# Patient Record
Sex: Male | Born: 1995 | Race: White | Hispanic: No | Marital: Single | Smoking: Never smoker
Health system: Southern US, Community
[De-identification: ages and names within clinical notes are randomized; demographics above are authoritative.]

## PROBLEM LIST (undated history)

## (undated) DIAGNOSIS — I780 Hereditary hemorrhagic telangiectasia: Secondary | ICD-10-CM

## (undated) DIAGNOSIS — D699 Hemorrhagic condition, unspecified: Secondary | ICD-10-CM

## (undated) HISTORY — DX: Hereditary hemorrhagic telangiectasia: I78.0

## (undated) HISTORY — PX: TONSILLECTOMY: SUR1361

## (undated) HISTORY — DX: Hemorrhagic condition, unspecified: D69.9

---
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
## 2017-11-24 ENCOUNTER — Encounter: Payer: Self-pay | Admitting: Emergency Medicine

## 2017-11-24 ENCOUNTER — Other Ambulatory Visit: Payer: Self-pay

## 2017-11-24 DIAGNOSIS — A419 Sepsis, unspecified organism: Principal | ICD-10-CM | POA: Diagnosis present

## 2017-11-24 DIAGNOSIS — K5792 Diverticulitis of intestine, part unspecified, without perforation or abscess without bleeding: Secondary | ICD-10-CM | POA: Diagnosis present

## 2017-11-24 DIAGNOSIS — Z720 Tobacco use: Secondary | ICD-10-CM

## 2017-11-24 DIAGNOSIS — R946 Abnormal results of thyroid function studies: Secondary | ICD-10-CM | POA: Diagnosis present

## 2017-11-24 DIAGNOSIS — K5090 Crohn's disease, unspecified, without complications: Secondary | ICD-10-CM | POA: Diagnosis present

## 2017-11-24 DIAGNOSIS — R1084 Generalized abdominal pain: Secondary | ICD-10-CM | POA: Diagnosis not present

## 2017-11-24 DIAGNOSIS — E860 Dehydration: Secondary | ICD-10-CM | POA: Diagnosis present

## 2017-11-24 DIAGNOSIS — Z880 Allergy status to penicillin: Secondary | ICD-10-CM

## 2017-11-24 LAB — CBC
HEMATOCRIT: 44.1 % (ref 40.0–52.0)
HEMOGLOBIN: 15 g/dL (ref 13.0–18.0)
MCH: 28.8 pg (ref 26.0–34.0)
MCHC: 34 g/dL (ref 32.0–36.0)
MCV: 84.8 fL (ref 80.0–100.0)
Platelets: 258 10*3/uL (ref 150–440)
RBC: 5.2 MIL/uL (ref 4.40–5.90)
RDW: 13.7 % (ref 11.5–14.5)
WBC: 20.3 10*3/uL — AB (ref 3.8–10.6)

## 2017-11-24 NOTE — ED Triage Notes (Signed)
Patient ambulatory to triage with steady gait, without difficulty or distress noted; pt reports since yesterday having generalized abd pain with no accomp symptoms; denies hx of same

## 2017-11-25 ENCOUNTER — Emergency Department: Payer: BLUE CROSS/BLUE SHIELD

## 2017-11-25 ENCOUNTER — Other Ambulatory Visit: Payer: Self-pay

## 2017-11-25 ENCOUNTER — Inpatient Hospital Stay
Admission: EM | Admit: 2017-11-25 | Discharge: 2017-11-26 | DRG: 872 | Disposition: A | Discharge status: Home / Self Care | Payer: BLUE CROSS/BLUE SHIELD | Attending: Internal Medicine | Admitting: Internal Medicine

## 2017-11-25 DIAGNOSIS — K5090 Crohn's disease, unspecified, without complications: Secondary | ICD-10-CM | POA: Diagnosis present

## 2017-11-25 DIAGNOSIS — K5792 Diverticulitis of intestine, part unspecified, without perforation or abscess without bleeding: Secondary | ICD-10-CM | POA: Diagnosis present

## 2017-11-25 DIAGNOSIS — R1084 Generalized abdominal pain: Secondary | ICD-10-CM | POA: Diagnosis present

## 2017-11-25 DIAGNOSIS — Z880 Allergy status to penicillin: Secondary | ICD-10-CM | POA: Diagnosis not present

## 2017-11-25 DIAGNOSIS — R946 Abnormal results of thyroid function studies: Secondary | ICD-10-CM | POA: Diagnosis present

## 2017-11-25 DIAGNOSIS — K5732 Diverticulitis of large intestine without perforation or abscess without bleeding: Secondary | ICD-10-CM | POA: Diagnosis not present

## 2017-11-25 DIAGNOSIS — R9389 Abnormal findings on diagnostic imaging of other specified body structures: Secondary | ICD-10-CM | POA: Diagnosis not present

## 2017-11-25 DIAGNOSIS — E860 Dehydration: Secondary | ICD-10-CM | POA: Diagnosis present

## 2017-11-25 DIAGNOSIS — A419 Sepsis, unspecified organism: Secondary | ICD-10-CM

## 2017-11-25 DIAGNOSIS — Z720 Tobacco use: Secondary | ICD-10-CM | POA: Diagnosis not present

## 2017-11-25 DIAGNOSIS — K529 Noninfective gastroenteritis and colitis, unspecified: Secondary | ICD-10-CM | POA: Diagnosis present

## 2017-11-25 LAB — TYPE AND SCREEN
ABO/RH(D): O POS
Antibody Screen: NEGATIVE

## 2017-11-25 LAB — URINALYSIS, COMPLETE (UACMP) WITH MICROSCOPIC
BACTERIA UA: NONE SEEN
BILIRUBIN URINE: NEGATIVE
GLUCOSE, UA: NEGATIVE mg/dL
HGB URINE DIPSTICK: NEGATIVE
Ketones, ur: 80 mg/dL — AB
LEUKOCYTES UA: NEGATIVE
NITRITE: NEGATIVE
PROTEIN: 30 mg/dL — AB
Specific Gravity, Urine: 1.029 (ref 1.005–1.030)
Squamous Epithelial / LPF: NONE SEEN (ref 0–5)
pH: 5 (ref 5.0–8.0)

## 2017-11-25 LAB — COMPREHENSIVE METABOLIC PANEL
ALK PHOS: 60 U/L (ref 38–126)
ALT: 23 U/L (ref 17–63)
AST: 26 U/L (ref 15–41)
Albumin: 4.9 g/dL (ref 3.5–5.0)
Anion gap: 14 (ref 5–15)
BUN: 13 mg/dL (ref 6–20)
CALCIUM: 9.5 mg/dL (ref 8.9–10.3)
CO2: 24 mmol/L (ref 22–32)
CREATININE: 0.96 mg/dL (ref 0.61–1.24)
Chloride: 96 mmol/L — ABNORMAL LOW (ref 101–111)
GFR calc Af Amer: >60 mL/min (ref >60)
Glucose, Bld: 93 mg/dL (ref 65–99)
Potassium: 3.9 mmol/L (ref 3.5–5.1)
Sodium: 134 mmol/L — ABNORMAL LOW (ref 135–145)
TOTAL PROTEIN: 8.5 g/dL — AB (ref 6.5–8.1)
Total Bilirubin: 1.3 mg/dL — ABNORMAL HIGH (ref 0.3–1.2)

## 2017-11-25 LAB — TSH: TSH: 14.875 u[IU]/mL — ABNORMAL HIGH (ref 0.350–4.500)

## 2017-11-25 LAB — LIPASE, BLOOD: LIPASE: 21 U/L (ref 11–51)

## 2017-11-25 LAB — PROTIME-INR
INR: 1.24
PROTHROMBIN TIME: 15.5 s — AB (ref 11.4–15.2)

## 2017-11-25 LAB — T4, FREE: Free T4: 1.06 ng/dL (ref 0.82–1.77)

## 2017-11-25 LAB — APTT: aPTT: 36 seconds (ref 24–36)

## 2017-11-25 MED ORDER — IOPAMIDOL (ISOVUE-300) INJECTION 61%
30.0000 mL | Freq: Once | INTRAVENOUS | Status: AC | PRN
  Administered 2017-11-25: 30 mL via ORAL

## 2017-11-25 MED ORDER — SODIUM CHLORIDE 0.9 % IV BOLUS
1000.0000 mL | INTRAVENOUS | Status: AC
  Administered 2017-11-25: 1000 mL via INTRAVENOUS

## 2017-11-25 MED ORDER — ACETAMINOPHEN 650 MG RE SUPP: 650.0000 mg | Freq: Four times a day (QID) | RECTAL | Status: DC | PRN

## 2017-11-25 MED ORDER — ONDANSETRON HCL 4 MG/2ML IJ SOLN
4.0000 mg | INTRAMUSCULAR | Status: AC
  Administered 2017-11-25: 4 mg via INTRAVENOUS
  Filled 2017-11-25: qty 2

## 2017-11-25 MED ORDER — SODIUM CHLORIDE 0.9 % IV SOLN
2.0000 g | Freq: Once | INTRAVENOUS | Status: AC
  Administered 2017-11-25: 2 g via INTRAVENOUS
  Filled 2017-11-25: qty 20

## 2017-11-25 MED ORDER — MORPHINE SULFATE (PF) 4 MG/ML IV SOLN
4.0000 mg | Freq: Once | INTRAVENOUS | Status: AC
  Administered 2017-11-25: 4 mg via INTRAVENOUS
  Filled 2017-11-25: qty 1

## 2017-11-25 MED ORDER — METRONIDAZOLE 500 MG PO TABS
500.0000 mg | ORAL_TABLET | Freq: Three times a day (TID) | ORAL | Status: DC
  Administered 2017-11-25 – 2017-11-26 (×5): 500 mg via ORAL
  Filled 2017-11-25 (×6): qty 1

## 2017-11-25 MED ORDER — SODIUM CHLORIDE 0.9 % IV SOLN: Freq: Once | INTRAVENOUS | Status: DC

## 2017-11-25 MED ORDER — IOPAMIDOL (ISOVUE-300) INJECTION 61%
100.0000 mL | Freq: Once | INTRAVENOUS | Status: AC | PRN
  Administered 2017-11-25: 100 mL via INTRAVENOUS

## 2017-11-25 MED ORDER — DOCUSATE SODIUM 100 MG PO CAPS
100.0000 mg | ORAL_CAPSULE | Freq: Two times a day (BID) | ORAL | Status: DC
  Administered 2017-11-25 (×2): 100 mg via ORAL
  Filled 2017-11-25 (×3): qty 1

## 2017-11-25 MED ORDER — METRONIDAZOLE IN NACL 5-0.79 MG/ML-% IV SOLN
500.0000 mg | Freq: Once | INTRAVENOUS | Status: AC
  Administered 2017-11-25: 500 mg via INTRAVENOUS
  Filled 2017-11-25: qty 100

## 2017-11-25 MED ORDER — ACETAMINOPHEN 325 MG PO TABS
650.0000 mg | ORAL_TABLET | Freq: Four times a day (QID) | ORAL | Status: DC | PRN
  Administered 2017-11-25 – 2017-11-26 (×3): 650 mg via ORAL
  Filled 2017-11-25 (×3): qty 2

## 2017-11-25 MED ORDER — DEXTROSE-NACL 5-0.45 % IV SOLN
INTRAVENOUS | Status: DC
  Administered 2017-11-25 – 2017-11-26 (×5): via INTRAVENOUS

## 2017-11-25 MED ORDER — CIPROFLOXACIN HCL 500 MG PO TABS
500.0000 mg | ORAL_TABLET | Freq: Two times a day (BID) | ORAL | Status: DC
  Administered 2017-11-25 – 2017-11-26 (×2): 500 mg via ORAL
  Filled 2017-11-25 (×2): qty 1

## 2017-11-25 MED ORDER — CIPROFLOXACIN HCL 500 MG PO TABS
500.0000 mg | ORAL_TABLET | Freq: Two times a day (BID) | ORAL | Status: DC
  Administered 2017-11-25: 500 mg via ORAL
  Filled 2017-11-25: qty 1

## 2017-11-25 MED ORDER — ONDANSETRON HCL 4 MG/2ML IJ SOLN: 4.0000 mg | Freq: Four times a day (QID) | INTRAMUSCULAR | Status: DC | PRN

## 2017-11-25 MED ORDER — ONDANSETRON HCL 4 MG PO TABS: 4.0000 mg | ORAL_TABLET | Freq: Four times a day (QID) | ORAL | Status: DC | PRN

## 2017-11-25 NOTE — H&P (Signed)
Bill [name] is an 22 y.o. male.   Chief Complaint: Abdominal pain HPI: The patient with no chronic medical problems presents to the emergency department complaining of abdominal pain x2 days.  The patient states the pain was worse yesterday and he had 3 episodes of nonbloody nonbilious emesis.  He has had some loose stools but really denies diarrhea.  Stools have been nonbloody as well.  CT of his abdomen in the emergency department showed inflammation in the ileocecal region which may represent underlying diverticulitis and/or Crohn's.  Staff initially thought that he may have peritonitis and he was given prednisone and Flagyl.  Once he was stabilized emergency department staff called the hospitalist service for admission.  History reviewed. No pertinent past medical history. None  Past Surgical History:  Procedure Laterality Date  . TONSILLECTOMY      No family history on file.  No chronic illnesses  Social History:  reports that he has never smoked. He uses smokeless tobacco. He reports that he does not drink alcohol. His drug history is not on file.  Allergies:  Allergies  Allergen Reactions  . Penicillins Hives    No medications prior to admission.    Results for orders placed or performed during the hospital encounter of 11/25/17 (from the past 48 hour(s))  Lipase, blood     Status: None   Collection Time: 11/24/17 10:41 PM  Result Value Ref Range   Lipase 21 11 - 51 U/L    Comment: Performed at [facility], [address] [place], [place] [address]  Comprehensive metabolic panel     Status: Abnormal   Collection Time: 11/24/17 10:41 PM  Result Value Ref Range   Sodium 134 (L) 135 - 145 mmol/L   Potassium 3.9 3.5 - 5.1 mmol/L   Chloride 96 (L) 101 - 111 mmol/L   CO2 24 22 - 32 mmol/L   Glucose, Bld 93 65 - 99 mg/dL   BUN 13 6 - 20 mg/dL   Creatinine, Ser 0.96 0.61 - 1.24 mg/dL   Calcium 9.5 8.9 - 10.3 mg/dL   Total Protein 8.5 (H) 6.5 - 8.1 g/dL   Albumin  4.9 3.5 - 5.0 g/dL   AST 26 15 - 41 U/L   ALT 23 17 - 63 U/L   Alkaline Phosphatase 60 38 - 126 U/L   Total Bilirubin 1.3 (H) 0.3 - 1.2 mg/dL   GFR calc non Af Amer >60 >60 mL/min   GFR calc Af Amer >60 >60 mL/min    Comment: (NOTE) The eGFR has been calculated using the CKD EPI equation. This calculation has not been validated in all clinical situations. eGFR's persistently <60 mL/min signify possible Chronic Kidney Disease.    Anion gap 14 5 - 15    Comment: Performed at [facility], [address] [place], [place] [address]  CBC     Status: Abnormal   Collection Time: 11/24/17 10:41 PM  Result Value Ref Range   WBC 20.3 (H) 3.8 - 10.6 K/uL   RBC 5.20 4.40 - 5.90 MIL/uL   Hemoglobin 15.0 13.0 - 18.0 g/dL   HCT 44.1 40.0 - 52.0 %   MCV 84.8 80.0 - 100.0 fL   MCH 28.8 26.0 - 34.0 pg   MCHC 34.0 32.0 - 36.0 g/dL   RDW 13.7 11.5 - 14.5 %   Platelets 258 150 - 440 K/uL    Comment: Performed at [facility], [address], [place], [place] [address]  Urinalysis,  Complete w Microscopic     Status: Abnormal   Collection Time: 11/24/17 10:41 PM  Result Value Ref Range   Color, Urine AMBER (A) YELLOW    Comment: BIOCHEMICALS MAY BE AFFECTED BY COLOR   APPearance CLEAR (A) CLEAR   Specific Gravity, Urine 1.029 1.005 - 1.030   pH 5.0 5.0 - 8.0   Glucose, UA NEGATIVE NEGATIVE mg/dL   Hgb urine dipstick NEGATIVE NEGATIVE   Bilirubin Urine NEGATIVE NEGATIVE   Ketones, ur 80 (A) NEGATIVE mg/dL   Protein, ur 30 (A) NEGATIVE mg/dL   Nitrite NEGATIVE NEGATIVE   Leukocytes, UA NEGATIVE NEGATIVE   RBC / HPF 0-5 0 - 5 RBC/hpf   WBC, UA 0-5 0 - 5 WBC/hpf   Bacteria, UA NONE SEEN NONE SEEN   Squamous Epithelial / LPF NONE SEEN 0 - 5   Mucus PRESENT     Comment: Performed at [facility], [address] [place], [place] [address]  TSH     Status: Abnormal   Collection Time: 11/24/17 10:41 PM  Result Value Ref Range   TSH 14.875 (H) 0.350 - 4.500 uIU/mL     Comment: Performed by a 3rd Generation assay with a functional sensitivity of <=0.01 uIU/mL. Performed at [facility], [address] [place], [place] [address]   Protime-INR     Status: Abnormal   Collection Time: 11/25/17  3:24 AM  Result Value Ref Range   Prothrombin Time 15.5 (H) 11.4 - 15.2 seconds   INR 1.24     Comment: Performed at [facility], [address] [place], [place] [address]  APTT     Status: None   Collection Time: 11/25/17  3:24 AM  Result Value Ref Range   aPTT 36 24 - 36 seconds    Comment: Performed at [facility], [address] [place], [place] [address]  Type and screen Ordered by PROVIDER DEFAULT     Status: None   Collection Time: 11/25/17  4:12 AM  Result Value Ref Range   ABO/RH(D) O POS    Antibody Screen NEG    Sample Expiration      11/28/2017 Performed at [facility], [address], [place], [place] [address]    Ct Abdomen Pelvis W Contrast  Result Date: 11/25/2017 CLINICAL DATA:  Acute onset of nausea and vomiting. Generalized abdominal pain. EXAM: CT ABDOMEN AND PELVIS WITH CONTRAST TECHNIQUE: Multidetector CT imaging of the abdomen and pelvis was performed using the standard protocol following bolus administration of intravenous contrast. CONTRAST:  170m ISOVUE-300 IOPAMIDOL (ISOVUE-300) INJECTION 61% COMPARISON:  CT of the abdomen and pelvis performed 06/05/2015 FINDINGS: Lower chest: The visualized lung bases are grossly clear. The visualized portions of the mediastinum are unremarkable. Hepatobiliary: The liver is unremarkable in appearance. The gallbladder is unremarkable in appearance. The common bile duct remains normal in caliber. Pancreas: The pancreas is within normal limits. Spleen: The spleen is enlarged, measuring 14.2 cm in length. Adrenals/Urinary Tract: The adrenal glands are unremarkable in appearance. The kidneys are within normal limits. There is no evidence of hydronephrosis. No  renal or ureteral stones are identified. No perinephric stranding is seen. Stomach/Bowel: There is diffuse soft tissue inflammation noted about the cecum and proximal ascending colon, and about the terminal ileum, with marked wall thickening and underlying pericecal lymphadenopathy. A 9 mm high-density focus adjacent to the cecum is nonspecific and could reside within an underlying inflamed diverticulum. This may reflect either significant diverticulitis or colitis. The  appendix is normal in caliber, without evidence of appendicitis. The remainder of the colon is unremarkable in appearance. The small bowel is grossly unremarkable. The stomach is largely filled with contrast and air, and is grossly unremarkable. Vascular/Lymphatic: The abdominal aorta is unremarkable in appearance. The inferior vena cava is grossly unremarkable. No retroperitoneal lymphadenopathy is seen. No pelvic sidewall lymphadenopathy is identified. Reproductive: The bladder is significantly distended and grossly unremarkable. The prostate remains normal in size. Other: A small amount of free fluid is noted within the pelvis. Musculoskeletal: No acute osseous abnormalities are identified. The visualized musculature is unremarkable in appearance. IMPRESSION: 1. Diffuse soft tissue inflammation about the cecum and proximal ascending colon, and about the terminal ileum, with marked wall thickening and underlying pericecal lymphadenopathy. 9 mm high-density focus adjacent to the cecum is nonspecific and could reside within an underlying inflamed diverticulum. This may reflect either significant diverticulitis or colitis. 2. Small amount of free fluid noted within the pelvis. 3. No evidence of appendicitis. 4. Splenomegaly. Electronically Signed   By: Garald Balding M.D.   On: 11/25/2017 04:02    ROS  Blood pressure 136/82, pulse 96, temperature 99.7 F (37.6 C), temperature source Oral, resp. rate (!) 0, height 5' 6" (1.676 m), weight 78 kg  (171 lb 15.3 oz), SpO2 100 %. Physical Exam   Assessment/Plan This is a 22 year old male admitted for colitis. 1.  Colitis: Differential diagnosis includes inflammatory bowel disease or diverticulitis.  Gastroenterology consulted. 2.  Sepsis: The patient meets criteria via fever and leukocytosis.  He is hemodynamically stable.  Follow blood cultures for growth and sensitivities. 3.  DVT prophylaxis: SCDs 4.  GI prophylaxis: None The patient is a full code.  Time spent on admission orders and patient care approximately 45 minutes  Harrie Foreman, MD 11/25/2017, 8:21 AM

## 2017-11-25 NOTE — Consult Note (Signed)
Wyline Mood , MD [address], [address], [place], [place], [address] [address], [place], [place], [address] Phone: [phone]  Fax: [phone]  Consultation  Referring Provider:  Dr Amado Coe Primary Care Physician:  Patient, No Pcp Per Primary Gastroenterologist:None     Reason for Consultation:  Colitis  Date of Admission:  11/25/2017 Date of Consultation:  11/25/2017         HPI:   Bill [name] is a 22 y.o. male presented to the emergency room on 11/24/2017 with abdominal pain over the last 2 days.  In the emergency room he was febrile.  He underwent a CT scan of the abdomen.  Demonstrated diffuse soft tissue inflammation around the cecum proximal ascending colon, terminal ileum with marked wall thickening and pericecal lymphadenopathy.  In addition there was a 9 mm high density focus adjacent to the cecum which was nonspecific and could have represented significant diverticulitis or colitis.  He has had a CT scan of the abdomen in December 2016 which was normal.  On admission he had a leukocytosis with a white cell count of 20,000.  Protein in the urine.  TSH is elevated.  Hemoglobin is 15 g.   He says he was doing well till Monday when all of a sudden he developed in the right side of the abdomen which woke him up from his sleep. He had some nausea and vomiting , subsequently he has started to feel better and tolerating PO at this time. He denies any similar episodes in the past . Denies any family history of colon cancer or IBD. He does "vape" .    History reviewed. No pertinent past medical history.  Past Surgical History:  Procedure Laterality Date  . TONSILLECTOMY      Prior to Admission medications   Not on File    No family history on file.   Social History   Tobacco Use  . Smoking status: Never Smoker  . Smokeless tobacco: Current User  Substance Use Topics  . Alcohol use: No  . Drug use: Not on file    Allergies as of 11/24/2017 - Review Complete  11/24/2017  Allergen Reaction Noted  . Penicillins Hives 06/05/2015    Review of Systems:    All systems reviewed and negative except where noted in HPI.   Physical Exam:  Vital signs in last 24 hours: Temp:  [98.4 F (36.9 C)-100.8 F (38.2 C)] 99.7 F (37.6 C) (06/05 0617) Pulse Rate:  [84-96] 96 (06/05 0617) Resp:  [0-18] 0 (06/05 0617) BP: (135-146)/(69-89) 136/82 (06/05 0617) SpO2:  [97 %-100 %] 100 % (06/05 0617) Weight:  [171 lb 15.3 oz (78 kg)-175 lb (79.4 kg)] 171 lb 15.3 oz (78 kg) (06/05 0617)   General:   Pleasant, cooperative in NAD Head:  Normocephalic and atraumatic. Eyes:   No icterus.   Conjunctiva pink. PERRLA. Ears:  Normal auditory acuity. Neck:  Supple; no masses or thyroidomegaly Lungs: Respirations even and unlabored. Lungs clear to auscultation bilaterally.   No wheezes, crackles, or rhonchi.  Heart:  Regular rate and rhythm;  Without murmur, clicks, rubs or gallops Abdomen:  Soft, nondistended, nontender. Normal bowel sounds. No appreciable masses or hepatomegaly.  No rebound or guarding.  Neurologic:  Alert and oriented x3;  grossly normal neurologically. Skin:  Intact without significant lesions or rashes. Cervical Nodes:  No significant cervical adenopathy. Psych:  Alert and cooperative. Normal affect.  LAB RESULTS: Recent Labs    11/24/17 2241  WBC  20.3*  HGB 15.0  HCT 44.1  PLT 258   BMET Recent Labs    11/24/17 2241  NA 134*  K 3.9  CL 96*  CO2 24  GLUCOSE 93  BUN 13  CREATININE 0.96  CALCIUM 9.5   LFT Recent Labs    11/24/17 2241  PROT 8.5*  ALBUMIN 4.9  AST 26  ALT 23  ALKPHOS 60  BILITOT 1.3*   PT/INR Recent Labs    11/25/17 0324  LABPROT 15.5*  INR 1.24    STUDIES: Ct Abdomen Pelvis W Contrast  Result Date: 11/25/2017 CLINICAL DATA:  Acute onset of nausea and vomiting. Generalized abdominal pain. EXAM: CT ABDOMEN AND PELVIS WITH CONTRAST TECHNIQUE: Multidetector CT imaging of the abdomen and pelvis was  performed using the standard protocol following bolus administration of intravenous contrast. CONTRAST:  ISOVUE-300 IOPAMIDOL (ISOVUE-300) INJECTION 61% COMPARISON:  CT of the abdomen and pelvis performed 06/05/2015 FINDINGS: Lower chest: The visualized lung bases are grossly clear. The visualized portions of the mediastinum are unremarkable. Hepatobiliary: The liver is unremarkable in appearance. The gallbladder is unremarkable in appearance. The common bile duct remains normal in caliber. Pancreas: The pancreas is within normal limits. Spleen: The spleen is enlarged, measuring 14.2 cm in length. Adrenals/Urinary Tract: The adrenal glands are unremarkable in appearance. The kidneys are within normal limits. There is no evidence of hydronephrosis. No renal or ureteral stones are identified. No perinephric stranding is seen. Stomach/Bowel: There is diffuse soft tissue inflammation noted about the cecum and proximal ascending colon, and about the terminal ileum, with marked wall thickening and underlying pericecal lymphadenopathy. A 9 mm high-density focus adjacent to the cecum is nonspecific and could reside within an underlying inflamed diverticulum. This may reflect either significant diverticulitis or colitis. The appendix is normal in caliber, without evidence of appendicitis. The remainder of the colon is unremarkable in appearance. The small bowel is grossly unremarkable. The stomach is largely filled with contrast and air, and is grossly unremarkable. Vascular/Lymphatic: The abdominal aorta is unremarkable in appearance. The inferior vena cava is grossly unremarkable. No retroperitoneal lymphadenopathy is seen. No pelvic sidewall lymphadenopathy is identified. Reproductive: The bladder is significantly distended and grossly unremarkable. The prostate remains normal in size. Other: A small amount of free fluid is noted within the pelvis. Musculoskeletal: No acute osseous abnormalities are identified. The  visualized musculature is unremarkable in appearance. IMPRESSION: 1. Diffuse soft tissue inflammation about the cecum and proximal ascending colon, and about the terminal ileum, with marked wall thickening and underlying pericecal lymphadenopathy. 9 mm high-density focus adjacent to the cecum is nonspecific and could reside within an underlying inflamed diverticulum. This may reflect either significant diverticulitis or colitis. 2. Small amount of free fluid noted within the pelvis. 3. No evidence of appendicitis. 4. Splenomegaly. Electronically Signed   By: Roanna Raider M.D.   On: 11/25/2017 04:02      Impression / Plan:   Bill [name] is a 22 y.o. y/o male admitted with a 2-day history of abdominal pain, CT scan of the abdomen performed in the emergency room shows features of inflammation of the terminal ileum cecum ascending colon there is concern for diverticulitis.  He presented with significant leukocytosis.  Differentials per radiology were diverticulitis but it is also possible at times Crohn's disease can present in a similar manner.  I think as a short-term goal of aim is to treat possible diverticulitis with antibiotics and subsequently when this acute episode has resolved he would definitely need  a colonoscopy with evaluation of the terminal ileum and ascending colon to ensure that it was in fact diverticulitis and not Crohn's disease.   Plan 1.  Continue treatment with ciprofloxacin and Flagyl which should treat diverticulitis as well as Crohn's disease and if this were to be the cause. 2.  Serial abdominal exams it is worsening of abdominal tenderness, tachycardia, guarding rigidity that would suggest repeat abdominal imaging to rule out a perforation.  3.  IV hydration.   4.  At some point his urine needs to be tested again to ensure there is no protein and if proteinuria persists that will require further evaluation. 5.  No NSAID's , Smoking to be stopped 6. Colonoscopy in 6 weeks  after outpatient office visit.      Thank you for involving me in the care of this patient.      LOS: 0 days   Wyline Mood, MD  11/25/2017, 8:14 AM

## 2017-11-25 NOTE — ED Notes (Signed)
Patient transported to room 215 by this EDT.

## 2017-11-25 NOTE — ED Notes (Addendum)
Reviewed pt's lab results; charge nurse notified of WBC

## 2017-11-25 NOTE — Progress Notes (Signed)
[facility] [place] [facility]   PATIENT NAME: Bill [name]    MR#:  [id number]  DATE OF BIRTH:  [birth year]  SUBJECTIVE:  CHIEF COMPLAINT:  Pt denies abd pain if he stays still, but RLQ PAIN 4/10 if he moves  REVIEW OF SYSTEMS:  CONSTITUTIONAL: No fever, fatigue or weakness.  EYES: No blurred or double vision.  EARS, NOSE, AND THROAT: No tinnitus or ear pain.  RESPIRATORY: No cough, shortness of breath, wheezing or hemoptysis.  CARDIOVASCULAR: No chest pain, orthopnea, edema.  GASTROINTESTINAL: No nausea, vomiting, diarrhea or abdominal pain.  GENITOURINARY: No dysuria, hematuria.  ENDOCRINE: No polyuria, nocturia,  HEMATOLOGY: No anemia, easy bruising or bleeding SKIN: No rash or lesion. MUSCULOSKELETAL: No joint pain or arthritis.   NEUROLOGIC: No tingling, numbness, weakness.  PSYCHIATRY: No anxiety or depression.   DRUG ALLERGIES:   Allergies  Allergen Reactions  . Penicillins Hives    VITALS:  Blood pressure 116/68, pulse 71, temperature 97.8 F (36.6 C), temperature source Oral, resp. rate (!) 0, height 5\' 6"  (1.676 m), weight 78 kg (171 lb 15.3 oz), SpO2 99 %.  PHYSICAL EXAMINATION:  GENERAL:  22 y.o.-year-old patient lying in the bed with no acute distress.  EYES: Pupils equal, round, reactive to light and accommodation. No scleral icterus. Extraocular muscles intact.  HEENT: Head atraumatic, normocephalic. Oropharynx and nasopharynx clear.  NECK:  Supple, no jugular venous distention. No thyroid enlargement, no tenderness.  LUNGS: Normal breath sounds bilaterally, no wheezing, rales,rhonchi or crepitation. No use of accessory muscles of respiration.  CARDIOVASCULAR: S1, S2 normal. No murmurs, rubs, or gallops.  ABDOMEN: Soft,RLQ tenderness, nondistended. Bowel sounds present. No organomegaly or mass.  EXTREMITIES: No pedal edema, cyanosis, or clubbing.  NEUROLOGIC: Cranial nerves II through XII are intact. Muscle strength 5/5 in all  extremities. Sensation intact. Gait not checked.  PSYCHIATRIC: The patient is alert and oriented x 3.  SKIN: No obvious rash, lesion, or ulcer.    LABORATORY PANEL:   CBC Recent Labs  Lab 11/24/17 2241  WBC 20.3*  HGB 15.0  HCT 44.1  PLT 258   ------------------------------------------------------------------------------------------------------------------  Chemistries  Recent Labs  Lab 11/24/17 2241  NA 134*  K 3.9  CL 96*  CO2 24  GLUCOSE 93  BUN 13  CREATININE 0.96  CALCIUM 9.5  AST 26  ALT 23  ALKPHOS 60  BILITOT 1.3*   ------------------------------------------------------------------------------------------------------------------  Cardiac Enzymes No results for input(s): TROPONINI in the last 168 hours. ------------------------------------------------------------------------------------------------------------------  RADIOLOGY:  Ct Abdomen Pelvis W Contrast  Result Date: 11/25/2017 CLINICAL DATA:  Acute onset of nausea and vomiting. Generalized abdominal pain. EXAM: CT ABDOMEN AND PELVIS WITH CONTRAST TECHNIQUE: Multidetector CT imaging of the abdomen and pelvis was performed using the standard protocol following bolus administration of intravenous contrast. CONTRAST:  ISOVUE-300 IOPAMIDOL (ISOVUE-300) INJECTION 61% COMPARISON:  CT of the abdomen and pelvis performed 06/05/2015 FINDINGS: Lower chest: The visualized lung bases are grossly clear. The visualized portions of the mediastinum are unremarkable. Hepatobiliary: The liver is unremarkable in appearance. The gallbladder is unremarkable in appearance. The common bile duct remains normal in caliber. Pancreas: The pancreas is within normal limits. Spleen: The spleen is enlarged, measuring 14.2 cm in length. Adrenals/Urinary Tract: The adrenal glands are unremarkable in appearance. The kidneys are within normal limits. There is no evidence of hydronephrosis. No renal or ureteral stones are identified. No  perinephric stranding is seen. Stomach/Bowel: There is diffuse soft tissue inflammation noted about the cecum and  proximal ascending colon, and about the terminal ileum, with marked wall thickening and underlying pericecal lymphadenopathy. A 9 mm high-density focus adjacent to the cecum is nonspecific and could reside within an underlying inflamed diverticulum. This may reflect either significant diverticulitis or colitis. The appendix is normal in caliber, without evidence of appendicitis. The remainder of the colon is unremarkable in appearance. The small bowel is grossly unremarkable. The stomach is largely filled with contrast and air, and is grossly unremarkable. Vascular/Lymphatic: The abdominal aorta is unremarkable in appearance. The inferior vena cava is grossly unremarkable. No retroperitoneal lymphadenopathy is seen. No pelvic sidewall lymphadenopathy is identified. Reproductive: The bladder is significantly distended and grossly unremarkable. The prostate remains normal in size. Other: A small amount of free fluid is noted within the pelvis. Musculoskeletal: No acute osseous abnormalities are identified. The visualized musculature is unremarkable in appearance. IMPRESSION: 1. Diffuse soft tissue inflammation about the cecum and proximal ascending colon, and about the terminal ileum, with marked wall thickening and underlying pericecal lymphadenopathy. 9 mm high-density focus adjacent to the cecum is nonspecific and could reside within an underlying inflamed diverticulum. This may reflect either significant diverticulitis or colitis. 2. Small amount of free fluid noted within the pelvis. 3. No evidence of appendicitis. 4. Splenomegaly. Electronically Signed   By: Roanna Raider M.D.   On: 11/25/2017 04:02    EKG:  No orders found for this or any previous visit.  ASSESSMENT AND PLAN:    This is a 22 year old male admitted for colitis. 1.  Colitis: Differential diagnosis includes inflammatory  bowel disease or diverticulitis.  Gastroenterology following Continue cipro and flagyl IVF COLONOSCOPY in 6 weeks OP NO NSAIDS 2.  Sepsis: The patient meets criteria via fever and leukocytosis.  He is hemodynamically stable.  Follow blood cultures for growth and sensitivities.   # Elevated TSH but nml free T4, RPT TFT in 6 weeks    3.  DVT prophylaxis: SCDs   4.  GI prophylaxis: None     All the records are reviewed and case discussed with Care Management/Social Workerr. Management plans discussed with the patient, mom and they are in agreement.  CODE STATUS: fc  TOTAL TIME TAKING CARE OF THIS PATIENT: 33 minutes.   POSSIBLE D/C IN 1-2 DAYS, DEPENDING ON CLINICAL CONDITION.  Note: This dictation was prepared with Dragon dictation along with smaller phrase technology. Any transcriptional errors that result from this process are unintentional.   Ramonita Lab M.D on 11/25/2017 at 4:10 PM  Between 7am to 6pm - Pager - [phone] After 6pm go to [email or web address] - password [email or web address]  [facility]  Office  [phone]  CC: Primary care physician; Patient, No Pcp Per

## 2017-11-25 NOTE — Progress Notes (Signed)
Pharmacy Antibiotic Note  Lanorris Krajicek is a 22 y.o. male admitted on 11/25/2017 with intra-abdominal colitis vs. diverticulitis.  Pharmacy has been consulted for cipro dosing.  Plan: Patient has been started on metronidazole 500 mg PO q8h  Will start ciprofloxacin 500 mg PO twice daily  Height: 5\' 6"  (167.6 cm) Weight: 171 lb 15.3 oz (78 kg) IBW/kg (Calculated) : 63.8  Temp (24hrs), Avg:99.6 F (37.6 C), Min:98.4 F (36.9 C), Max:100.8 F (38.2 C)  Recent Labs  Lab 11/24/17 2241  WBC 20.3*  CREATININE 0.96    Estimated Creatinine Clearance: 120.7 mL/min (by C-G formula based on SCr of 0.96 mg/dL).    Allergies  Allergen Reactions  . Penicillins Hives    Thank you for allowing pharmacy to be a part of this patient's care.  Thomasene Ripple, PharmD, BCPS Clinical Pharmacist 11/25/2017

## 2017-11-25 NOTE — ED Provider Notes (Signed)
[facility] Emergency Department Provider Note  ____________________________________________   First MD Initiated Contact with Patient 11/25/17 [phone]     (approximate)  I have reviewed the triage vital signs and the nursing notes.   HISTORY  Chief Complaint Abdominal Pain    HPI Bill [name] is a 22 y.o. male with no chronic medical history but whose mother reports that he has "some sort of coagulation disorder".  He presents for evaluation of gradually worsening generalized abdominal pain over the last 2 days.  He says that it started mild and just above his bellybutton but it is gradually gotten worse and now involves his entire abdomen.  Any amount of movement, laughing, coughing, etc. makes it much worse.  Nothing particular makes it better except holding completely still.  He had 3 episodes of vomiting over the course of the last 24 hours and says he has had no appetite and has not had much to eat or drink.  He denies fever/chills, chest pain, shortness of breath, diarrhea, and dysuria.  He has never had any abdominal surgeries.  History reviewed. No pertinent past medical history.  Patient Active Problem List   Diagnosis Date Noted  . Colitis 11/25/2017    Past Surgical History:  Procedure Laterality Date  . TONSILLECTOMY      Prior to Admission medications   Not on File    Allergies Penicillins  No family history on file.  Social History Social History   Tobacco Use  . Smoking status: Never Smoker  . Smokeless tobacco: Current User  Substance Use Topics  . Alcohol use: No  . Drug use: Not on file    Review of Systems Constitutional: No fever/chills Eyes: No visual changes. ENT: No sore throat. Cardiovascular: Denies chest pain. Respiratory: Denies shortness of breath. Gastrointestinal: Abdominal pain with nausea, vomiting, and anorexia as described above.  No diarrhea. Genitourinary: Negative for dysuria. Musculoskeletal:  Negative for neck pain.  Negative for back pain. Integumentary: Negative for rash. Neurological: Negative for headaches, focal weakness or numbness.   ____________________________________________   PHYSICAL EXAM:  VITAL SIGNS: ED Triage Vitals [11/24/17 2238]  Enc Vitals Group     BP (!) 141/69     Pulse Rate 93     Resp 18     Temp 98.4 F (36.9 C)     Temp Source Oral     SpO2 100 %     Weight 79.4 kg (175 lb)     Height 1.676 m (5\' 6" )     Head Circumference      Peak Flow      Pain Score 8     Pain Loc      Pain Edu?      Excl. in GC?     Constitutional: Alert and oriented. Well appearing and in no acute distress but does appear uncomfortable. Eyes: Conjunctivae are normal.  Head: Atraumatic. Nose: No congestion/rhinnorhea. Mouth/Throat: Mucous membranes are moist. Neck: No stridor.  No meningeal signs.   Cardiovascular: Normal rate, regular rhythm. Good peripheral circulation. Grossly normal heart sounds. Respiratory: Normal respiratory effort.  No retractions. Lungs CTAB. Gastrointestinal: Tense abdomen with severe tenderness to palpation throughout with guarding and rebound, concerning for generalized peritonitis. Musculoskeletal: No lower extremity tenderness nor edema. No gross deformities of extremities. Neurologic:  Normal speech and language. No gross focal neurologic deficits are appreciated.  Skin:  Skin is warm to the point of feeling febrile, dry and intact. No rash noted. Psychiatric: Mood and  affect are normal. Speech and behavior are normal.  ____________________________________________   LABS (all labs ordered are listed, but only abnormal results are displayed)  Labs Reviewed  COMPREHENSIVE METABOLIC PANEL - Abnormal; Notable for the following components:      Result Value   Sodium 134 (*)    Chloride 96 (*)    Total Protein 8.5 (*)    Total Bilirubin 1.3 (*)    All other components within normal limits  CBC - Abnormal; Notable for the  following components:   WBC 20.3 (*)    All other components within normal limits  URINALYSIS, COMPLETE (UACMP) WITH MICROSCOPIC - Abnormal; Notable for the following components:   Color, Urine AMBER (*)    APPearance CLEAR (*)    Ketones, ur 80 (*)    Protein, ur 30 (*)    All other components within normal limits  PROTIME-INR - Abnormal; Notable for the following components:   Prothrombin Time 15.5 (*)    All other components within normal limits  LIPASE, BLOOD  APTT  TSH  TYPE AND SCREEN   ____________________________________________  EKG  None - EKG not ordered by ED physician ____________________________________________  RADIOLOGY   ED MD interpretation: Extensive colitis and possible diverticulitis that involves the cecum and terminal ileum with significant wall thickening and the question of possible diverticulitis.  Official radiology report(s): Ct Abdomen Pelvis W Contrast  Result Date: 11/25/2017 CLINICAL DATA:  Acute onset of nausea and vomiting. Generalized abdominal pain. EXAM: CT ABDOMEN AND PELVIS WITH CONTRAST TECHNIQUE: Multidetector CT imaging of the abdomen and pelvis was performed using the standard protocol following bolus administration of intravenous contrast. CONTRAST:  ISOVUE-300 IOPAMIDOL (ISOVUE-300) INJECTION 61% COMPARISON:  CT of the abdomen and pelvis performed 06/05/2015 FINDINGS: Lower chest: The visualized lung bases are grossly clear. The visualized portions of the mediastinum are unremarkable. Hepatobiliary: The liver is unremarkable in appearance. The gallbladder is unremarkable in appearance. The common bile duct remains normal in caliber. Pancreas: The pancreas is within normal limits. Spleen: The spleen is enlarged, measuring 14.2 cm in length. Adrenals/Urinary Tract: The adrenal glands are unremarkable in appearance. The kidneys are within normal limits. There is no evidence of hydronephrosis. No renal or ureteral stones are identified. No  perinephric stranding is seen. Stomach/Bowel: There is diffuse soft tissue inflammation noted about the cecum and proximal ascending colon, and about the terminal ileum, with marked wall thickening and underlying pericecal lymphadenopathy. A 9 mm high-density focus adjacent to the cecum is nonspecific and could reside within an underlying inflamed diverticulum. This may reflect either significant diverticulitis or colitis. The appendix is normal in caliber, without evidence of appendicitis. The remainder of the colon is unremarkable in appearance. The small bowel is grossly unremarkable. The stomach is largely filled with contrast and air, and is grossly unremarkable. Vascular/Lymphatic: The abdominal aorta is unremarkable in appearance. The inferior vena cava is grossly unremarkable. No retroperitoneal lymphadenopathy is seen. No pelvic sidewall lymphadenopathy is identified. Reproductive: The bladder is significantly distended and grossly unremarkable. The prostate remains normal in size. Other: A small amount of free fluid is noted within the pelvis. Musculoskeletal: No acute osseous abnormalities are identified. The visualized musculature is unremarkable in appearance. IMPRESSION: 1. Diffuse soft tissue inflammation about the cecum and proximal ascending colon, and about the terminal ileum, with marked wall thickening and underlying pericecal lymphadenopathy. 9 mm high-density focus adjacent to the cecum is nonspecific and could reside within an underlying inflamed diverticulum. This may reflect either significant  diverticulitis or colitis. 2. Small amount of free fluid noted within the pelvis. 3. No evidence of appendicitis. 4. Splenomegaly. Electronically Signed   By: Roanna Raider M.D.   On: 11/25/2017 04:02    ____________________________________________   PROCEDURES  Critical Care performed: No   Procedure(s) performed:    Procedures   ____________________________________________   INITIAL IMPRESSION / ASSESSMENT AND PLAN / ED COURSE  As part of my medical decision making, I reviewed the following data within the electronic MEDICAL RECORD NUMBER History obtained from family, Nursing notes reviewed and incorporated, Labs reviewed , Discussed with admitting physician  and A phone consult was requested and obtained from this/these consultant(s) Gastroenterology    Differential diagnosis includes, but is not limited to, acute appendicitis, renal colic, IBD/Crohn's flare, diverticulitis, small bowel obstruction or ileus, non-specific colitis, abdominal aortic aneurysm, hernia, etc.  However, based on his current presentation, I am most concerned about appendicitis.  Without any pain medicine on board his exam is consistent with generalized peritonitis with rebound tenderness and guarding.  He has started drinking contrast so an oral pressure recheck is not appropriate but his axillary temperature is 100.8 and I believe he is currently febrile more in the range of 102 degrees oral.  He has a leukocytosis of 20.3 and although his conference of metabolic panel is within normal limits, he has 80 ketones in his urine suggesting significant volume depletion consistent with his recent anorexia.  He was borderline tachycardic when he arrived and that has improved slightly with rest but I am overall concerned about his presentation.  I am going to give him empiric antibiotics given the probability that he has an acute intra-abdominal infection, possibly with perforation.  As per the ED Antibiotics order set for an immunocompetent patient with appendicitis, I have ordered ceftriaxone 2 g IV and metronidazole 500 mg IV.  I have also ordered 1 L of normal saline IV bolus, morphine 4 mg IV, and Zofran 4 mg IV.  He is hemodynamically stable at this point and I have explained my plan to the patient and his family.  Clinical Course as of  Nov 25 717  Wed Nov 25, 2017  0253 Since his family is reporting a nonspecific bleeding disorder or clotting disorder, I have added on coagulation studies and a type and screen   [CF]  79 Paging Dr. Norma Fredrickson a second time to discuss CT results.   [CF]  [id number] Unfortunately we were unable to get a hold of Dr. Norma Fredrickson.  However given the patient's constellation of symptoms including fever, white count of 28, inability to tolerate p.o., ketonuria suggestive of volume depletion, etc., I feel the patient would be better in the hospital for GI consultation, IV fluids, additional antibiotics, and possibly additional work-up which could include a colonoscopy.  I strongly suspect Crohn's disease, or possibly UC.  I discussed it by phone with Dr. Sheryle Hail with hospitalist service who agreed with the plan.  I also discussed it with mother who agrees with the plan.   [CF]  0525 I did not initially make the patient a code sepsis probability is going to be an intra-abdominal issue such as appendicitis and that typically is handled as a surgical matter.  The patient is hemodynamically stable and has already received 2 g of ceftriaxone IV metronidazole 500 mg IV.  There is no point in obtaining blood cultures at this time.  There is no evidence of septic shock and I do not think it is beneficial to  click on the "code sepsis" protocol at this point.  He is already receiving the appropriate treatment.   [CF]  [id number] I am ordering a second liter bolus of normal saline   [CF]  Of note, at approximately 6:30 AM Dr. Norma Fredrickson returned the pages.  We discussed the case and he agreed with my plan for admission and put the patient was placed so he can see him.  He agreed that IBD is probably the most likely cause and deserves further investigation.   Clinical Course User Index [CF] Loleta Rose, MD    ____________________________________________  FINAL CLINICAL IMPRESSION(S) / ED DIAGNOSES  Final diagnoses:  Sepsis, due to  unspecified organism (HCC)  Colitis  Dehydration     MEDICATIONS GIVEN DURING THIS VISIT:  Medications  acetaminophen (TYLENOL) tablet 650 mg (has no administration in time range)    Or  acetaminophen (TYLENOL) suppository 650 mg (has no administration in time range)  docusate sodium (COLACE) capsule 100 mg (has no administration in time range)  ondansetron (ZOFRAN) tablet 4 mg (has no administration in time range)    Or  ondansetron (ZOFRAN) injection 4 mg (has no administration in time range)  dextrose 5 %-0.45 % sodium chloride infusion ( Intravenous New Bag/Given 11/25/17 0617)  metroNIDAZOLE (FLAGYL) tablet 500 mg (500 mg Oral Given 11/25/17 0639)  ciprofloxacin (CIPRO) tablet 500 mg (has no administration in time range)  sodium chloride 0.9 % bolus 1,000 mL (0 mLs Intravenous Stopped 11/25/17 0542)  morphine 4 MG/ML injection 4 mg (4 mg Intravenous Given 11/25/17 0244)  ondansetron (ZOFRAN) injection 4 mg (4 mg Intravenous Given 11/25/17 0244)  iopamidol (ISOVUE-300) 61 % injection 30 mL (30 mLs Oral Contrast Given 11/25/17 0247)  cefTRIAXone (ROCEPHIN) 2 g in sodium chloride 0.9 % 100 mL IVPB (0 g Intravenous Stopped 11/25/17 0355)    And  metroNIDAZOLE (FLAGYL) IVPB 500 mg (0 mg Intravenous Stopped 11/25/17 0542)  iopamidol (ISOVUE-300) 61 % injection 100 mL (100 mLs Intravenous Contrast Given 11/25/17 0345)  sodium chloride 0.9 % bolus 1,000 mL (1,000 mLs Intravenous Transfusing/Transfer 11/25/17 0603)     ED Discharge Orders    None       Note:  This document was prepared using Dragon voice recognition software and may include unintentional dictation errors.    Loleta Rose, MD 11/25/17 [phone]

## 2017-11-26 DIAGNOSIS — K5732 Diverticulitis of large intestine without perforation or abscess without bleeding: Secondary | ICD-10-CM

## 2017-11-26 LAB — URINALYSIS, ROUTINE W REFLEX MICROSCOPIC
BILIRUBIN URINE: NEGATIVE
GLUCOSE, UA: NEGATIVE mg/dL
HGB URINE DIPSTICK: NEGATIVE
Ketones, ur: NEGATIVE mg/dL
Leukocytes, UA: NEGATIVE
Nitrite: NEGATIVE
Protein, ur: NEGATIVE mg/dL
SPECIFIC GRAVITY, URINE: 1.009 (ref 1.005–1.030)
pH: 7 (ref 5.0–8.0)

## 2017-11-26 LAB — CBC
HEMATOCRIT: 39.1 % — AB (ref 40.0–52.0)
Hemoglobin: 13.3 g/dL (ref 13.0–18.0)
MCH: 29.3 pg (ref 26.0–34.0)
MCHC: 34 g/dL (ref 32.0–36.0)
MCV: 86 fL (ref 80.0–100.0)
Platelets: 215 10*3/uL (ref 150–440)
RBC: 4.55 MIL/uL (ref 4.40–5.90)
RDW: 13.7 % (ref 11.5–14.5)
WBC: 10.3 10*3/uL (ref 3.8–10.6)

## 2017-11-26 MED ORDER — TRAMADOL HCL 50 MG PO TABS: 50.0000 mg | ORAL_TABLET | Freq: Four times a day (QID) | ORAL | 0 refills | Status: AC | PRN

## 2017-11-26 MED ORDER — METRONIDAZOLE 500 MG PO TABS: 500.0000 mg | ORAL_TABLET | Freq: Three times a day (TID) | ORAL | 0 refills | Status: AC

## 2017-11-26 MED ORDER — ACETAMINOPHEN 325 MG PO TABS: 325.0000 mg | ORAL_TABLET | Freq: Four times a day (QID) | ORAL | Status: AC | PRN

## 2017-11-26 MED ORDER — CIPROFLOXACIN HCL 500 MG PO TABS
500.0000 mg | ORAL_TABLET | Freq: Two times a day (BID) | ORAL | 0 refills | Status: AC
Start: 2017-11-26 — End: 2017-12-06

## 2017-11-26 NOTE — Discharge Instructions (Signed)
Follow Up with gastroenterology in 6 weeks Follow-up with primary care physician in 5 to 7 days

## 2017-11-26 NOTE — Discharge Summary (Signed)
[facility] - [place] at [facility]   PATIENT NAME: Bill [name]    MR#:  [id number]  DATE OF BIRTH:  1995/06/30  DATE OF ADMISSION:  11/25/2017 ADMITTING PHYSICIAN: Arnaldo Natal, MD  DATE OF DISCHARGE: 11/26/17  PRIMARY CARE PHYSICIAN: Patient, No Pcp Per    ADMISSION DIAGNOSIS:  Dehydration [E86.0] Colitis [K52.9] Sepsis, due to unspecified organism (HCC) [A41.9]  DISCHARGE DIAGNOSIS:  Active Problems:   Colitis   SECONDARY DIAGNOSIS:  History reviewed. No pertinent past medical history.  HOSPITAL COURSE:   HPI: The patient with no chronic medical problems presents to the emergency department complaining of abdominal pain x2 days.  The patient states the pain was worse yesterday and he had 3 episodes of nonbloody nonbilious emesis.  He has had some loose stools but really denies diarrhea.  Stools have been nonbloody as well.  CT of his abdomen in the emergency department showed inflammation in the ileocecal region which may represent underlying diverticulitis and/or Crohn's.  Staff initially thought that he may have peritonitis and he was given prednisone and Flagyl.  Once he was stabilized emergency department staff called the hospitalist service for admission.   . Colitis: Differential diagnosis includes inflammatory bowel disease or diverticulitis.  Tolerating advanced diet well Gastroenterology following, patient is stable okay to discharge from GI standpoint and outpatient follow-up in 6 weeks Continue cipro and flagyl p.o. for 10 days IVF provided COLONOSCOPY in 6 weeks OP NO NSAIDS  2. Sepsis: The patient meets criteria via fever and leukocytosis. He is hemodynamically stable. Blood cultures never drawn I do not see any results   # Elevated TSH but nml free T4, RPT TFT in 6 weeks Follow-up with primary care physician  3. DVT prophylaxis: SCDs   4. GI prophylaxis: None    DISCHARGE CONDITIONS:   stable  CONSULTS  OBTAINED:  Treatment Team:  Wyline Mood, MD   PROCEDURES  none   DRUG ALLERGIES:   Allergies  Allergen Reactions  . Penicillins Hives    DISCHARGE MEDICATIONS:   Allergies as of 11/26/2017      Reactions   Penicillins Hives      Medication List    TAKE these medications   acetaminophen 325 MG tablet Commonly known as:  TYLENOL Take 1-2 tablets (325-650 mg total) by mouth every 6 (six) hours as needed for mild pain, moderate pain, fever or headache (or Fever >/= 101).   ciprofloxacin 500 MG tablet Commonly known as:  CIPRO Take 1 tablet (500 mg total) by mouth 2 (two) times daily for 10 days.   metroNIDAZOLE 500 MG tablet Commonly known as:  FLAGYL Take 1 tablet (500 mg total) by mouth every 8 (eight) hours for 10 days.   traMADol 50 MG tablet Commonly known as:  ULTRAM Take 1 tablet (50 mg total) by mouth every 6 (six) hours as needed for severe pain.        DISCHARGE INSTRUCTIONS:   Follow Up with gastroenterology in 6 weeks Follow-up with primary care physician in 5 to 7 days  DIET:  Regular diet  DISCHARGE CONDITION:  Stable  ACTIVITY:  Activity as tolerated  OXYGEN:  Home Oxygen: No.   Oxygen Delivery: room air  DISCHARGE LOCATION:  home   If you experience worsening of your admission symptoms, develop shortness of breath, life threatening emergency, suicidal or homicidal thoughts you must seek medical attention immediately by calling 911 or calling your MD immediately  if symptoms less severe.  You  Must read complete instructions/literature along with all the possible adverse reactions/side effects for all the Medicines you take and that have been prescribed to you. Take any new Medicines after you have completely understood and accpet all the possible adverse reactions/side effects.   Please note  You were cared for by a hospitalist during your hospital stay. If you have any questions about your discharge medications or the care you received  while you were in the hospital after you are discharged, you can call the unit and asked to speak with the hospitalist on call if the hospitalist that took care of you is not available. Once you are discharged, your primary care physician will handle any further medical issues. Please note that NO REFILLS for any discharge medications will be authorized once you are discharged, as it is imperative that you return to your primary care physician (or establish a relationship with a primary care physician if you do not have one) for your aftercare needs so that they can reassess your need for medications and monitor your lab values.     Today  Chief Complaint  Patient presents with  . Abdominal Pain   Patient tolerated the advance diet well.  Abdominal pain significantly improved.  Wants to go home.  Grandparents at bedside Recommended to quit smoking and stop using NSAIDs over-the-counter. Outpatient follow-up with gastroenterology  ROS:  CONSTITUTIONAL: Denies fevers, chills. Denies any fatigue, weakness.  EYES: Denies blurry vision, double vision, eye pain. EARS, NOSE, THROAT: Denies tinnitus, ear pain, hearing loss. RESPIRATORY: Denies cough, wheeze, shortness of breath.  CARDIOVASCULAR: Denies chest pain, palpitations, edema.  GASTROINTESTINAL: Denies nausea, vomiting, diarrhea, abdominal pain. Denies bright red blood per rectum. GENITOURINARY: Denies dysuria, hematuria. ENDOCRINE: Denies nocturia or thyroid problems. HEMATOLOGIC AND LYMPHATIC: Denies easy bruising or bleeding. SKIN: Denies rash or lesion. MUSCULOSKELETAL: Denies pain in neck, back, shoulder, knees, hips or arthritic symptoms.  NEUROLOGIC: Denies paralysis, paresthesias.  PSYCHIATRIC: Denies anxiety or depressive symptoms.   VITAL SIGNS:  Blood pressure 125/71, pulse 79, temperature 98.3 F (36.8 C), temperature source Oral, resp. rate 16, height 5\' 6"  (1.676 m), weight 77.6 kg (171 lb), SpO2 100 %.  I/O:     Intake/Output Summary (Last 24 hours) at 11/26/2017 1450 Last data filed at 11/26/2017 1057 Gross per 24 hour  Intake 3069.5 ml  Output 900 ml  Net 2169.5 ml    PHYSICAL EXAMINATION:  GENERAL:  22 y.o.-year-old patient lying in the bed with no acute distress.  EYES: Pupils equal, round, reactive to light and accommodation. No scleral icterus. Extraocular muscles intact.  HEENT: Head atraumatic, normocephalic. Oropharynx and nasopharynx clear.  NECK:  Supple, no jugular venous distention. No thyroid enlargement, no tenderness.  LUNGS: Normal breath sounds bilaterally, no wheezing, rales,rhonchi or crepitation. No use of accessory muscles of respiration.  CARDIOVASCULAR: S1, S2 normal. No murmurs, rubs, or gallops.  ABDOMEN: Soft, non-tender, non-distended. Bowel sounds present. No organomegaly or mass.  EXTREMITIES: No pedal edema, cyanosis, or clubbing.  NEUROLOGIC: Cranial nerves II through XII are intact. Muscle strength 5/5 in all extremities. Sensation intact. Gait not checked.  PSYCHIATRIC: The patient is alert and oriented x 3.  SKIN: No obvious rash, lesion, or ulcer.   DATA REVIEW:   CBC Recent Labs  Lab 11/26/17 1204  WBC 10.3  HGB 13.3  HCT 39.1*  PLT 215    Chemistries  Recent Labs  Lab 11/24/17 2241  NA 134*  K 3.9  CL 96*  CO2 24  GLUCOSE 93  BUN 13  CREATININE 0.96  CALCIUM 9.5  AST 26  ALT 23  ALKPHOS 60  BILITOT 1.3*    Cardiac Enzymes No results for input(s): TROPONINI in the last 168 hours.  Microbiology Results  No results found for this or any previous visit.  RADIOLOGY:  Ct Abdomen Pelvis W Contrast  Result Date: 11/25/2017 CLINICAL DATA:  Acute onset of nausea and vomiting. Generalized abdominal pain. EXAM: CT ABDOMEN AND PELVIS WITH CONTRAST TECHNIQUE: Multidetector CT imaging of the abdomen and pelvis was performed using the standard protocol following bolus administration of intravenous contrast. CONTRAST:  ISOVUE-300  IOPAMIDOL (ISOVUE-300) INJECTION 61% COMPARISON:  CT of the abdomen and pelvis performed 06/05/2015 FINDINGS: Lower chest: The visualized lung bases are grossly clear. The visualized portions of the mediastinum are unremarkable. Hepatobiliary: The liver is unremarkable in appearance. The gallbladder is unremarkable in appearance. The common bile duct remains normal in caliber. Pancreas: The pancreas is within normal limits. Spleen: The spleen is enlarged, measuring 14.2 cm in length. Adrenals/Urinary Tract: The adrenal glands are unremarkable in appearance. The kidneys are within normal limits. There is no evidence of hydronephrosis. No renal or ureteral stones are identified. No perinephric stranding is seen. Stomach/Bowel: There is diffuse soft tissue inflammation noted about the cecum and proximal ascending colon, and about the terminal ileum, with marked wall thickening and underlying pericecal lymphadenopathy. A 9 mm high-density focus adjacent to the cecum is nonspecific and could reside within an underlying inflamed diverticulum. This may reflect either significant diverticulitis or colitis. The appendix is normal in caliber, without evidence of appendicitis. The remainder of the colon is unremarkable in appearance. The small bowel is grossly unremarkable. The stomach is largely filled with contrast and air, and is grossly unremarkable. Vascular/Lymphatic: The abdominal aorta is unremarkable in appearance. The inferior vena cava is grossly unremarkable. No retroperitoneal lymphadenopathy is seen. No pelvic sidewall lymphadenopathy is identified. Reproductive: The bladder is significantly distended and grossly unremarkable. The prostate remains normal in size. Other: A small amount of free fluid is noted within the pelvis. Musculoskeletal: No acute osseous abnormalities are identified. The visualized musculature is unremarkable in appearance. IMPRESSION: 1. Diffuse soft tissue inflammation about the cecum and  proximal ascending colon, and about the terminal ileum, with marked wall thickening and underlying pericecal lymphadenopathy. 9 mm high-density focus adjacent to the cecum is nonspecific and could reside within an underlying inflamed diverticulum. This may reflect either significant diverticulitis or colitis. 2. Small amount of free fluid noted within the pelvis. 3. No evidence of appendicitis. 4. Splenomegaly. Electronically Signed   By: Roanna Raider M.D.   On: 11/25/2017 04:02    EKG:  No orders found for this or any previous visit.    Management plans discussed with the patient, family and they are in agreement.  CODE STATUS:     Code Status Orders  (From admission, onward)        Start     Ordered   11/25/17 0613  Full code  Continuous     11/25/17 0612    Code Status History    This patient has a current code status but no historical code status.      TOTAL TIME TAKING CARE OF THIS PATIENT: 41 minutes.   Note: This dictation was prepared with Dragon dictation along with smaller phrase technology. Any transcriptional errors that result from this process are unintentional.   @M[facility] @  on 11/26/2017 at 2:50 PM  Between 7am to 6pm -  Pager - [phone]  After 6pm go to [email or web address] - password [email or web address] [place] [facility]  Office  [phone]  CC: Primary care physician; Patient, No Pcp Per

## 2017-11-26 NOTE — Progress Notes (Signed)
Wyline Mood , MD [address], [place], [place], [address] [address], [place], [place], [address] Phone: [phone]  Fax: [phone]   Davaughn Mallak is being followed for diverticulitis  Day 1 of follow up   Subjective:  Feels much better, some pain persists, tolerating a full diet   Objective: Vital signs in last 24 hours: Vitals:   11/25/17 1255 11/25/17 2019 11/26/17 0412 11/26/17 0500  BP: 116/68 125/69 124/84   Pulse: 71 89 82   Resp:  18 18   Temp: 97.8 F (36.6 C) 98.9 F (37.2 C) 99 F (37.2 C)   TempSrc: Oral Oral Oral   SpO2: 99% 99% 98%   Weight:    171 lb (77.6 kg)  Height:       Weight change: -4 lb (-1.814 kg)  Intake/Output Summary (Last 24 hours) at 11/26/2017 1001 Last data filed at 11/26/2017 0759 Gross per 24 hour  Intake 3179.07 ml  Output 400 ml  Net 2779.07 ml     Exam: Heart:: Regular rate and rhythm, S1S2 present or without murmur or extra heart sounds Lungs: normal, clear to auscultation and clear to auscultation and percussion Abdomen: soft,mild RLQ tenderness, no rebound, no guarding normal bowel sounds   Lab Results: @LABTEST2 @ Micro Results: No results found for this or any previous visit (from the past 240 hour(s)). Studies/Results: Ct Abdomen Pelvis W Contrast  Result Date: 11/25/2017 CLINICAL DATA:  Acute onset of nausea and vomiting. Generalized abdominal pain. EXAM: CT ABDOMEN AND PELVIS WITH CONTRAST TECHNIQUE: Multidetector CT imaging of the abdomen and pelvis was performed using the standard protocol following bolus administration of intravenous contrast. CONTRAST:  ISOVUE-300 IOPAMIDOL (ISOVUE-300) INJECTION 61% COMPARISON:  CT of the abdomen and pelvis performed 06/05/2015 FINDINGS: Lower chest: The visualized lung bases are grossly clear. The visualized portions of the mediastinum are unremarkable. Hepatobiliary: The liver is unremarkable in appearance. The gallbladder is unremarkable in  appearance. The common bile duct remains normal in caliber. Pancreas: The pancreas is within normal limits. Spleen: The spleen is enlarged, measuring 14.2 cm in length. Adrenals/Urinary Tract: The adrenal glands are unremarkable in appearance. The kidneys are within normal limits. There is no evidence of hydronephrosis. No renal or ureteral stones are identified. No perinephric stranding is seen. Stomach/Bowel: There is diffuse soft tissue inflammation noted about the cecum and proximal ascending colon, and about the terminal ileum, with marked wall thickening and underlying pericecal lymphadenopathy. A 9 mm high-density focus adjacent to the cecum is nonspecific and could reside within an underlying inflamed diverticulum. This may reflect either significant diverticulitis or colitis. The appendix is normal in caliber, without evidence of appendicitis. The remainder of the colon is unremarkable in appearance. The small bowel is grossly unremarkable. The stomach is largely filled with contrast and air, and is grossly unremarkable. Vascular/Lymphatic: The abdominal aorta is unremarkable in appearance. The inferior vena cava is grossly unremarkable. No retroperitoneal lymphadenopathy is seen. No pelvic sidewall lymphadenopathy is identified. Reproductive: The bladder is significantly distended and grossly unremarkable. The prostate remains normal in size. Other: A small amount of free fluid is noted within the pelvis. Musculoskeletal: No acute osseous abnormalities are identified. The visualized musculature is unremarkable in appearance. IMPRESSION: 1. Diffuse soft tissue inflammation about the cecum and proximal ascending colon, and about the terminal ileum, with marked wall thickening and underlying pericecal lymphadenopathy. 9 mm high-density focus adjacent to the cecum is nonspecific and could reside within an underlying inflamed diverticulum. This  may reflect either significant diverticulitis or colitis. 2. Small  amount of free fluid noted within the pelvis. 3. No evidence of appendicitis. 4. Splenomegaly. Electronically Signed   By: Roanna Raider M.D.   On: 11/25/2017 04:02   Medications: I have reviewed the patient's current medications. Scheduled Meds: . ciprofloxacin  500 mg Oral BID  . docusate sodium  100 mg Oral BID  . metroNIDAZOLE  500 mg Oral Q8H   Continuous Infusions: . dextrose 5 % and 0.45% NaCl 125 mL/hr at 11/26/17 0400   PRN Meds:.acetaminophen **OR** acetaminophen, ondansetron **OR** ondansetron (ZOFRAN) IV   Assessment: Active Problems:   Colitis    Cleo Villamizar is a 22 y.o. y/o male admitted with a 2-day history of abdominal pain, CT scan of the abdomen performed in the emergency room shows features of inflammation of the terminal ileum cecum ascending colon there is concern for diverticulitis.  He presented with significant leukocytosis.  Differentials per radiology were diverticulitis but it is also possible at times Crohn's disease can present in a similar manner.  I think as a short-term goal of aim is to treat possible diverticulitis with antibiotics and subsequently when this acute episode has resolved he would definitely need a colonoscopy with evaluation of the terminal ileum and ascending colon to ensure that it was in fact diverticulitis and not Crohn's disease.   Plan 1.  Continue treatment with ciprofloxacin and Flagyl for 10 days  2.  No NSAID's , Smoking to be stopped 3. Colonoscopy in 6 weeks after outpatient office visit.  4. Home when pain free and tolerating full diet     LOS: 1 day   Wyline Mood, MD 11/26/2017, 10:01 AM

## 2017-11-26 NOTE — Progress Notes (Signed)
Bill [name]  A and O x 4. VSS. Pt tolerating diet well. No complaints of pain or nausea. IV removed intact, prescriptions given. Pt voiced understanding of discharge instructions with no further questions. Pt discharged via wheelchair with axillary.    Allergies as of 11/26/2017      Reactions   Penicillins Hives      Medication List    TAKE these medications   acetaminophen 325 MG tablet Commonly known as:  TYLENOL Take 1-2 tablets (325-650 mg total) by mouth every 6 (six) hours as needed for mild pain, moderate pain, fever or headache (or Fever >/= 101).   ciprofloxacin 500 MG tablet Commonly known as:  CIPRO Take 1 tablet (500 mg total) by mouth 2 (two) times daily for 10 days.   metroNIDAZOLE 500 MG tablet Commonly known as:  FLAGYL Take 1 tablet (500 mg total) by mouth every 8 (eight) hours for 10 days.   traMADol 50 MG tablet Commonly known as:  ULTRAM Take 1 tablet (50 mg total) by mouth every 6 (six) hours as needed for severe pain.       Vitals:   11/26/17 0412 11/26/17 1218  BP: 124/84 125/71  Pulse: 82 79  Resp: 18 16  Temp: 99 F (37.2 C) 98.3 F (36.8 C)  SpO2: 98% 100%    Suzzanne Cloud

## 2017-12-31 ENCOUNTER — Telehealth: Payer: Self-pay | Admitting: Gastroenterology

## 2017-12-31 NOTE — Telephone Encounter (Signed)
Needed to move patients appt due to Dr. Maximino Greenland not in the office. Patients mother stated she would have him call us back to reschedule.

## 2018-01-05 ENCOUNTER — Ambulatory Visit: Payer: BLUE CROSS/BLUE SHIELD | Admitting: Gastroenterology

## 2018-01-29 ENCOUNTER — Encounter: Payer: Self-pay | Admitting: *Deleted

## 2018-02-01 ENCOUNTER — Encounter: Payer: Self-pay | Admitting: *Deleted

## 2018-02-01 ENCOUNTER — Ambulatory Visit
Admission: RE | Admit: 2018-02-01 | Discharge: 2018-02-01 | Disposition: A | Discharge status: Home / Self Care | Payer: BLUE CROSS/BLUE SHIELD | Source: Ambulatory Visit | Attending: Gastroenterology | Admitting: Gastroenterology

## 2018-02-01 ENCOUNTER — Encounter
Admission: RE | Disposition: A | Discharge status: Home / Self Care | Payer: Self-pay | Source: Ambulatory Visit | Attending: Gastroenterology

## 2018-02-01 ENCOUNTER — Ambulatory Visit: Payer: BLUE CROSS/BLUE SHIELD | Admitting: Anesthesiology

## 2018-02-01 DIAGNOSIS — R933 Abnormal findings on diagnostic imaging of other parts of digestive tract: Secondary | ICD-10-CM | POA: Diagnosis present

## 2018-02-01 DIAGNOSIS — Z880 Allergy status to penicillin: Secondary | ICD-10-CM | POA: Diagnosis not present

## 2018-02-01 HISTORY — PX: COLONOSCOPY WITH PROPOFOL: SHX5780

## 2018-02-01 SURGERY — COLONOSCOPY WITH PROPOFOL
Anesthesia: General

## 2018-02-01 MED ORDER — SODIUM CHLORIDE 0.9 % IV SOLN
INTRAVENOUS | Status: DC
  Administered 2018-02-01: 14:00:00 via INTRAVENOUS

## 2018-02-01 MED ORDER — FENTANYL CITRATE (PF) 100 MCG/2ML IJ SOLN
INTRAMUSCULAR | Status: DC | PRN
  Administered 2018-02-01: 50 ug via INTRAVENOUS

## 2018-02-01 MED ORDER — MIDAZOLAM HCL 2 MG/2ML IJ SOLN
INTRAMUSCULAR | Status: DC | PRN
  Administered 2018-02-01: 2 mg via INTRAVENOUS

## 2018-02-01 MED ORDER — PROPOFOL 500 MG/50ML IV EMUL
INTRAVENOUS | Status: DC | PRN
  Administered 2018-02-01: 120 ug/kg/min via INTRAVENOUS

## 2018-02-01 MED ORDER — MIDAZOLAM HCL 2 MG/2ML IJ SOLN
INTRAMUSCULAR | Status: AC
  Filled 2018-02-01: qty 2

## 2018-02-01 MED ORDER — FENTANYL CITRATE (PF) 100 MCG/2ML IJ SOLN
INTRAMUSCULAR | Status: AC
  Filled 2018-02-01: qty 2

## 2018-02-01 MED ORDER — PROPOFOL 500 MG/50ML IV EMUL
INTRAVENOUS | Status: AC
  Filled 2018-02-01: qty 50

## 2018-02-01 NOTE — Anesthesia Preprocedure Evaluation (Addendum)
Anesthesia Evaluation  Patient identified by MRN, date of birth, ID band Patient awake    Reviewed: Allergy & Precautions, H&P , NPO status , reviewed documented beta blocker date and time   Airway Mallampati: II  TM Distance: >3 FB Neck ROM: full    Dental  (+) Teeth Intact   Pulmonary    Pulmonary exam normal        Cardiovascular Normal cardiovascular exam     Neuro/Psych    GI/Hepatic   Endo/Other    Renal/GU      Musculoskeletal   Abdominal   Peds  Hematology   Anesthesia Other Findings Sepsis and Colitis on admission in June  Past Surgical History: No date: TONSILLECTOMY  BMI    Body Mass Index:  26.63 kg/m      Reproductive/Obstetrics                            Anesthesia Physical Anesthesia Plan  ASA: II  Anesthesia Plan: General   Post-op Pain Management:    Induction: Intravenous  PONV Risk Score and Plan: 2 and Treatment may vary due to age or medical condition and TIVA  Airway Management Planned: Nasal Cannula and Natural Airway  Additional Equipment:   Intra-op Plan:   Post-operative Plan:   Informed Consent: I have reviewed the patients History and Physical, chart, labs and discussed the procedure including the risks, benefits and alternatives for the proposed anesthesia with the patient or authorized representative who has indicated his/her understanding and acceptance.   Dental Advisory Given  Plan Discussed with: CRNA  Anesthesia Plan Comments:        Anesthesia Quick Evaluation

## 2018-02-01 NOTE — Anesthesia Post-op Follow-up Note (Signed)
Anesthesia QCDR form completed.        

## 2018-02-01 NOTE — Anesthesia Procedure Notes (Signed)
Performed by: Cook-Martin, Naliah Eddington Pre-anesthesia Checklist: Patient identified, Emergency Drugs available, Suction available, Patient being monitored and Timeout performed Patient Re-evaluated:Patient Re-evaluated prior to induction Oxygen Delivery Method: Nasal cannula Preoxygenation: Pre-oxygenation with 100% oxygen Induction Type: IV induction Placement Confirmation: positive ETCO2 and CO2 detector       

## 2018-02-01 NOTE — Transfer of Care (Signed)
Immediate Anesthesia Transfer of Care Note  Patient: Bill [name]  Procedure(s) Performed: COLONOSCOPY WITH PROPOFOL (N/A )  Patient Location: PACU and Endoscopy Unit  Anesthesia Type:General  Level of Consciousness: awake  Airway & Oxygen Therapy: Patient connected to nasal cannula oxygen  Post-op Assessment: Report given to RN  Post vital signs: stable  Last Vitals:  Vitals Value Taken Time  BP 99/55 02/01/2018  2:15 PM  Temp 35.6 C 02/01/2018  2:15 PM  Pulse 61 02/01/2018  2:16 PM  Resp 13 02/01/2018  2:16 PM  SpO2 100 % 02/01/2018  2:16 PM  Vitals shown include unvalidated device data.  Last Pain:  Vitals:   02/01/18 1415  TempSrc: Tympanic         Complications: No apparent anesthesia complications

## 2018-02-01 NOTE — H&P (Signed)
Outpatient short stay form Pre-procedure 02/01/2018 1:24 PM Christena Deem MD  Primary Physician: No primary care physician  Reason for visit: Colonoscopy  History of present illness: Patient is a 22 year old male presenting today as above.  He has a history of hospitalization for abdominal pain about 2 months ago.  At that time he had a CT scan showing a abnormality in the ileocecal area.  This hospitalization was at [facility].  They did not do any kind of luminal evaluation other than the CT scan at the time.  He states she is feeling much better at this point but continues to have some discomfort in the suprapubic region and lower abdomen.  Tolerated his prep well.  He takes no aspirin or blood thinning agent.  He was apparently treated with antibiotics at the time.  The CT scan showed "diffuse soft tissue inflammation about the cecum and proximal ascending colon and about the terminal ileum with marked wall thickening and underlying pericecal lymphadenopathy.  There was a 9 mm high density focus adjacent to the cecum there was nonspecific may have been a underlying inflamed diverticulum possibly these findings showing significant diverticulitis versus colitis.  There is no apparent evidence of appendicitis.    Current Facility-Administered Medications:  .  0.9 %  sodium chloride infusion, , Intravenous, Continuous, Christena Deem, MD  Medications Prior to Admission  Medication Sig Dispense Refill Last Dose  . acetaminophen (TYLENOL) 325 MG tablet Take 1-2 tablets (325-650 mg total) by mouth every 6 (six) hours as needed for mild pain, moderate pain, fever or headache (or Fever >/= 101).   Past Month at Unknown time  . ciprofloxacin (CIPRO) 500 MG tablet Take 500 mg by mouth 2 (two) times daily.   Past Month at Unknown time  . metroNIDAZOLE (FLAGYL) 500 MG tablet Take 500 mg by mouth 3 (three) times daily.   Past Month at Unknown time  . diphenhydrAMINE (BENADRYL) 12.5 MG chewable tablet Chew  12.5 mg by mouth 4 (four) times daily as needed for allergies.   Not Taking at Unknown time  . traMADol (ULTRAM) 50 MG tablet Take 1 tablet (50 mg total) by mouth every 6 (six) hours as needed for severe pain. (Patient not taking: Reported on 02/01/2018) 20 tablet 0 Not Taking at Unknown time     Allergies  Allergen Reactions  . Penicillins Hives     History reviewed. No pertinent past medical history.  Review of systems:      Physical Exam    Heart and lungs: Regular rate and rhythm without rub or gallop, lungs are bilaterally clear.    HEENT: Normocephalic atraumatic eyes are anicteric    Other:    Pertinant exam for procedure: Soft, mild tenderness to palpation in the lower abdomen midline to more so right side.  There are no masses or rebound.  Bowel sounds positive normoactive    Planned proceedures: Colonoscopy and indicated procedures. I have discussed the risks benefits and complications of procedures to include not limited to bleeding, infection, perforation and the risk of sedation and the patient wishes to proceed.    Christena Deem, MD Gastroenterology 02/01/2018  1:24 PM

## 2018-02-01 NOTE — Op Note (Signed)
[facility] Gastroenterology Patient Name: Bill [name] Procedure Date: 02/01/2018 1:27 PM MRN: [id number] Account #: [id number] Date of Birth: [birth year] Admit Type: Outpatient Age: 22 Room: [facility] ENDO ROOM 3 Gender: Male Note Status: Finalized Procedure:            Colonoscopy Indications:          Abnormal CT of the GI tract Providers:            Christena Deem, MD Medicines:            Monitored Anesthesia Care Complications:        No immediate complications. Procedure:            Pre-Anesthesia Assessment:                       - ASA Grade Assessment: II - A patient with mild                        systemic disease.                       After obtaining informed consent, the colonoscope was                        passed under direct vision. Throughout the procedure,                        the patient's blood pressure, pulse, and oxygen                        saturations were monitored continuously. The was                        introduced through the anus and advanced to the the                        cecum, identified by appendiceal orifice and ileocecal                        valve. The colonoscopy was performed without                        difficulty. The patient tolerated the procedure well.                        The quality of the bowel preparation was good. Findings:      The colon (entire examined portion) appeared normal.      I attempted to intubate the terminal ileum though unsuccessfully. There       was no abnormality of the opening of the terminal ileum noted.      There was no evidence of abnormal lesion, polyps diverticuli,       arteriovenous malformations or mucosal fragility throughout.      The digital rectal exam was normal. Impression:           - The entire examined colon is normal.                       - No specimens collected. Recommendation:       - Discharge patient to home.                       -  Perform a computed  tomographic (CT scan) enterography                        at appointment to be scheduled. Procedure Code(s):    --- Professional ---                       [phone], Colonoscopy, flexible; diagnostic, including                        collection of specimen(s) by brushing or washing, when                        performed (separate procedure) Diagnosis Code(s):    --- Professional ---                       R93.3, Abnormal findings on diagnostic imaging of other                        parts of digestive tract CPT copyright 2017 American Medical Association. All rights reserved. The codes documented in this report are preliminary and upon coder review may  be revised to meet current compliance requirements. Christena Deem, MD 02/01/2018 2:17:57 PM This report has been signed electronically. Number of Addenda: 0 Note Initiated On: 02/01/2018 1:27 PM Scope Withdrawal Time: 0 hours 4 minutes 44 seconds  Total Procedure Duration: 0 hours 23 minutes 33 seconds       [facility]

## 2018-02-02 ENCOUNTER — Encounter: Payer: Self-pay | Admitting: Gastroenterology

## 2018-02-03 NOTE — Anesthesia Postprocedure Evaluation (Signed)
Anesthesia Post Note  Patient: Bill [name]  Procedure(s) Performed: COLONOSCOPY WITH PROPOFOL (N/A )  Patient location during evaluation: Endoscopy Anesthesia Type: General Level of consciousness: awake and alert Pain management: pain level controlled Vital Signs Assessment: post-procedure vital signs reviewed and stable Respiratory status: spontaneous breathing, nonlabored ventilation and respiratory function stable Cardiovascular status: blood pressure returned to baseline and stable Postop Assessment: no apparent nausea or vomiting Anesthetic complications: no     Last Vitals:  Vitals:   02/01/18 1447 02/01/18 1450  BP: 102/60   Pulse: (!) 57 64  Resp: (!) 9 15  Temp:    SpO2: 100% 100%    Last Pain:  Vitals:   02/01/18 1415  TempSrc: Tympanic                 [name]

## 2018-02-25 ENCOUNTER — Other Ambulatory Visit: Payer: Self-pay | Admitting: Gastroenterology

## 2018-02-25 DIAGNOSIS — K529 Noninfective gastroenteritis and colitis, unspecified: Secondary | ICD-10-CM

## 2018-02-25 DIAGNOSIS — R933 Abnormal findings on diagnostic imaging of other parts of digestive tract: Secondary | ICD-10-CM

## 2018-03-08 ENCOUNTER — Ambulatory Visit: Admission: RE | Admit: 2018-03-08 | Payer: BLUE CROSS/BLUE SHIELD | Source: Ambulatory Visit

## 2018-04-20 ENCOUNTER — Ambulatory Visit
Admission: RE | Admit: 2018-04-20 | Discharge: 2018-04-20 | Disposition: A | Discharge status: Home / Self Care | Payer: BLUE CROSS/BLUE SHIELD | Source: Ambulatory Visit | Attending: Gastroenterology | Admitting: Gastroenterology

## 2018-04-20 DIAGNOSIS — R1031 Right lower quadrant pain: Secondary | ICD-10-CM | POA: Diagnosis present

## 2018-04-20 DIAGNOSIS — K529 Noninfective gastroenteritis and colitis, unspecified: Secondary | ICD-10-CM

## 2018-04-20 DIAGNOSIS — R933 Abnormal findings on diagnostic imaging of other parts of digestive tract: Secondary | ICD-10-CM | POA: Diagnosis not present

## 2018-04-20 MED ORDER — IOHEXOL 300 MG/ML  SOLN
100.0000 mL | Freq: Once | INTRAMUSCULAR | Status: AC | PRN
  Administered 2018-04-20: 100 mL via INTRAVENOUS

## 2020-06-20 ENCOUNTER — Ambulatory Visit
Admission: EM | Admit: 2020-06-20 | Discharge: 2020-06-20 | Disposition: A | Discharge status: Home / Self Care | Payer: BC Managed Care – PPO | Attending: Family Medicine | Admitting: Family Medicine

## 2020-06-20 ENCOUNTER — Other Ambulatory Visit: Payer: Self-pay

## 2020-06-20 DIAGNOSIS — Z20822 Contact with and (suspected) exposure to covid-19: Secondary | ICD-10-CM | POA: Diagnosis present

## 2020-06-20 NOTE — ED Triage Notes (Signed)
Patient here for COVID testing, positive exposure, no symptoms 

## 2020-06-21 LAB — SARS CORONAVIRUS 2 (TAT 6-24 HRS): SARS Coronavirus 2: NEGATIVE

## 2022-08-29 NOTE — Progress Notes (Deleted)
    New patient visit   Patient: Bill [name]   DOB: 01-Dec-1995   26 y.o. Male  MRN: [id number] Visit Date: 09/01/2022  Today's healthcare provider: Eulis Foster, MD   No chief complaint on file.  Subjective    Bill [name] is a 27 y.o. male who presents today as a new patient to establish care.  HPI  ***  No past medical history on file. Past Surgical History:  Procedure Laterality Date   COLONOSCOPY WITH PROPOFOL N/A 02/01/2018   Procedure: COLONOSCOPY WITH PROPOFOL;  Surgeon: Lollie Sails, MD;  Location: [facility] ENDOSCOPY;  Service: Endoscopy;  Laterality: N/A;   TONSILLECTOMY     No family status information on file.   No family history on file. Social History   Socioeconomic History   Marital status: Single    Spouse name: Not on file   Number of children: Not on file   Years of education: Not on file   Highest education level: Not on file  Occupational History   Not on file  Tobacco Use   Smoking status: Never   Smokeless tobacco: Current  Substance and Sexual Activity   Alcohol use: No   Drug use: Not on file   Sexual activity: Not on file  Other Topics Concern   Not on file  Social History Narrative   Not on file   Social Determinants of Health   Financial Resource Strain: Not on file  Food Insecurity: Not on file  Transportation Needs: Not on file  Physical Activity: Not on file  Stress: Not on file  Social Connections: Not on file   Outpatient Medications Prior to Visit  Medication Sig   acetaminophen (TYLENOL) 325 MG tablet Take 1-2 tablets (325-650 mg total) by mouth every 6 (six) hours as needed for mild pain, moderate pain, fever or headache (or Fever >/= 101).   ciprofloxacin (CIPRO) 500 MG tablet Take 500 mg by mouth 2 (two) times daily.   diphenhydrAMINE (BENADRYL) 12.5 MG chewable tablet Chew 12.5 mg by mouth 4 (four) times daily as needed for allergies.   metroNIDAZOLE (FLAGYL) 500 MG tablet Take 500 mg by mouth 3  (three) times daily.   No facility-administered medications prior to visit.   Allergies  Allergen Reactions   Penicillins Hives     There is no immunization history on file for this patient.  Health Maintenance  Topic Date Due   COVID-19 Vaccine (1) Never done   HPV VACCINES (1 - Male 2-dose series) Never done   HIV Screening  Never done   Hepatitis C Screening  Never done   DTaP/Tdap/Td (1 - Tdap) Never done   INFLUENZA VACCINE  Never done    Patient Care Team: Pa, [facility] as PCP - General  Review of Systems  {Labs  Heme  Chem  Endocrine  Serology  Results Review (optional):23779}   Objective    There were no vitals taken for this visit. {Show previous vital signs (optional):23777}  Physical Exam ***  Depression Screen     No data to display         No results found for any visits on 09/01/22.  Assessment & Plan     ***  No follow-ups on file.     {provider attestation***:1}   Eulis Foster, MD  [facility] [phone] (phone) [phone] (fax)  [place]

## 2022-08-31 ENCOUNTER — Encounter: Payer: Self-pay | Admitting: Family Medicine

## 2022-09-01 ENCOUNTER — Ambulatory Visit: Payer: BC Managed Care – PPO | Admitting: Family Medicine

## 2022-09-01 DIAGNOSIS — Z7689 Persons encountering health services in other specified circumstances: Secondary | ICD-10-CM

## 2022-11-11 ENCOUNTER — Encounter: Payer: Self-pay | Admitting: Family Medicine

## 2022-11-11 ENCOUNTER — Ambulatory Visit: Payer: BC Managed Care – PPO | Admitting: Family Medicine

## 2022-11-11 VITALS — BP 116/77 | HR 79 | Temp 98.6°F | Resp 16 | Ht 67.0 in | Wt 181.5 lb

## 2022-11-11 DIAGNOSIS — R1312 Dysphagia, oropharyngeal phase: Secondary | ICD-10-CM | POA: Insufficient documentation

## 2022-11-11 DIAGNOSIS — Z114 Encounter for screening for human immunodeficiency virus [HIV]: Secondary | ICD-10-CM | POA: Diagnosis not present

## 2022-11-11 DIAGNOSIS — Z7689 Persons encountering health services in other specified circumstances: Secondary | ICD-10-CM | POA: Diagnosis not present

## 2022-11-11 DIAGNOSIS — Z0000 Encounter for general adult medical examination without abnormal findings: Secondary | ICD-10-CM

## 2022-11-11 DIAGNOSIS — R7989 Other specified abnormal findings of blood chemistry: Secondary | ICD-10-CM

## 2022-11-11 DIAGNOSIS — E038 Other specified hypothyroidism: Secondary | ICD-10-CM

## 2022-11-11 DIAGNOSIS — Z1159 Encounter for screening for other viral diseases: Secondary | ICD-10-CM

## 2022-11-11 NOTE — Assessment & Plan Note (Signed)
TSH of 14 reviewed from 2014  Will repeat levels today

## 2022-11-11 NOTE — Assessment & Plan Note (Signed)
Hepatitis C & HIV screenings ordered today

## 2022-11-11 NOTE — Assessment & Plan Note (Signed)
Acute  Has been becoming more frequent  Will collect CBC to check for anemia or eosinophils, CMP collected  Recommended GI referral for possible EGD, referral submitted

## 2022-11-11 NOTE — Patient Instructions (Addendum)
A referral has been placed on your behalf for Gastroenterology.  Our referral coordination team or the office you will be visiting will contact you within the next 2 weeks.  If you have not received a phone call within 10 business days please let us know so that we can check into this for you.   We will follow up with results of labs once they are available.

## 2022-11-11 NOTE — Progress Notes (Signed)
I,Joseline E Rosas,acting as a scribe for [employer], MD.,have documented all relevant documentation on the behalf of Ronnald Ramp, MD,as directed by  Ronnald Ramp, MD while in the presence of Ronnald Ramp, MD.   New patient visit   Patient: Bill [name]   DOB: [birth year]   27 y.o. Male  MRN: [id number] Visit Date: 11/11/2022  Today's healthcare provider: Ronnald Ramp, MD   Chief Complaint  Patient presents with   Establish Care   Choking   Subjective    Bill [name] is a 26 y.o. male who presents today as a new patient to establish care.  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 Jenne Campus for ENT    Social Hx  Tobacco use: No- vape every day Alcohol Use : 2 cans of beer per week/ 3 shots of liquor per week, reports that drinks more socially than on a regular basis  Illicit drug use: None    Concerns for Today:   Choking on food:patient reports this has been going on for a year. Reports is worse with meats. Reports that he can feel the food getting stuck. While eating steak, he had to cough up the food so that he could breath better. These episodes occur up to three times per week. He usually has to take a break from eating to allow the globus to either come back up or move down. He reports that when the food gets to the base of his neck is when he has issues with the food moving  He denies hx of asthma or eczema  Thinks he may have a  food allergy because he has had lip swelling and hives after eating but did not have the same reaction when he tried that food again He has not had hives again in over 4 years  He denies abdominal bloating, excessive flatulence, abnormal bowel movements    Past Medical History:  Diagnosis Date   Bleeding disorder (HCC)    Hereditary hemorrhagic telangiectasia (HCC)    Past Surgical History:  Procedure Laterality Date   COLONOSCOPY WITH PROPOFOL N/A 02/01/2018   Procedure: COLONOSCOPY WITH PROPOFOL;  Surgeon: Christena Deem, MD;  Location[facility] ENDOSCOPY;  Service: Endoscopy;  Laterality: N/A;   TONSILLECTOMY     No family status information on file.   History reviewed. No pertinent family history. Social History   Socioeconomic History   Marital status: Single    Spouse name: Not on file   Number of children: Not on file   Years of education: Not on file   Highest education level: Not on file  Occupational History   Not on file  Tobacco Use   Smoking status: Never   Smokeless tobacco: Current  Vaping Use   Vaping Use: Every day   Start date: 10/22/2018  Substance and Sexual Activity   Alcohol use: Yes    Alcohol/week: 5.0 standard drinks of alcohol    Types: 2 Cans of beer, 3 Shots of liquor per  week   Drug use: Never   Sexual activity: Not on file  Other Topics Concern   Not on file  Social History Narrative   Not on file   Social Determinants of Health   Financial Resource Strain: Not on file  Food Insecurity: Not on file  Transportation Needs: Not on file  Physical Activity: Not on file  Stress: Not on file  Social Connections: Not on file   Outpatient Medications Prior to Visit  Medication Sig   acetaminophen (TYLENOL) 325 MG tablet Take 1-2 tablets (325-650 mg total) by mouth every 6 (six) hours as needed for mild pain, moderate pain, fever or headache (or Fever >/= 101).   gentamicin ointment (GARAMYCIN) 0.1 % Apply topically 3 (three) times daily.    [DISCONTINUED] ciprofloxacin (CIPRO) 500 MG tablet Take 500 mg by mouth 2 (two) times daily.   [DISCONTINUED] diphenhydrAMINE (BENADRYL) 12.5 MG chewable tablet Chew 12.5 mg by mouth 4 (four) times daily as needed for allergies.   [DISCONTINUED] metroNIDAZOLE (FLAGYL) 500 MG tablet Take 500 mg by mouth 3 (three) times daily.   No facility-administered medications prior to visit.   Allergies  Allergen Reactions   Penicillins Hives     There is no immunization history on file for this patient.  Health Maintenance  Topic Date Due   COVID-19 Vaccine (1) Never done   HPV VACCINES (1 - Male 2-dose series) Never done   HIV Screening  Never done   Hepatitis C Screening  Never done   DTaP/Tdap/Td (1 - Tdap) Never done   INFLUENZA VACCINE  01/22/2023    Patient Care Team: Ronnald Ramp, MD as PCP - General (Family Medicine)  Review of Systems  All other systems reviewed and are negative.      Objective    BP 116/77 (BP Location: Left Arm, Patient Position: Sitting, Cuff Size: Large)   Pulse 79   Temp 98.6 F (37 C) (Oral)   Resp 16   Ht 5\' 7"  (1.702 m)   Wt 181 lb 8 oz (82.3 kg)   BMI 28.43 kg/m    Physical Exam Vitals reviewed.  Constitutional:      General: He is not in acute distress.    Appearance: Normal appearance. He is not ill-appearing, toxic-appearing or diaphoretic.  HENT:     Mouth/Throat:     Lips: Pink. No lesions.     Mouth: Mucous membranes are moist. No injury, lacerations, oral lesions or angioedema.     Dentition: No dental tenderness.     Tongue: No lesions. Tongue does not deviate from midline.     Palate: No mass and lesions.     Pharynx: Oropharynx is clear. No pharyngeal swelling, oropharyngeal exudate, posterior oropharyngeal erythema or uvula swelling.  Eyes:     Conjunctiva/sclera: Conjunctivae normal.  Cardiovascular:     Rate and Rhythm: Normal rate and regular rhythm.     Pulses: Normal pulses.     Heart sounds: Normal  heart sounds. No murmur heard.    No friction rub. No gallop.  Pulmonary:     Effort: Pulmonary effort is normal. No respiratory distress.     Breath sounds: Normal breath sounds. No stridor. No wheezing, rhonchi or rales.  Abdominal:     General: Bowel sounds are normal. There is no distension.     Palpations: Abdomen is soft.     Tenderness: There is no abdominal tenderness.  Musculoskeletal:     Right lower leg: No edema.  Left lower leg: No edema.  Skin:    Findings: No erythema or rash.  Neurological:     Mental Status: He is alert and oriented to person, place, and time.      Depression Screen    11/11/2022    9:51 AM  PHQ 2/9 Scores  PHQ - 2 Score 0   No results found for any visits on 11/11/22.  Assessment & Plan      Problem List Items Addressed This Visit       Respiratory   Oropharyngeal dysphagia - Primary    Acute  Has been becoming more frequent  Will collect CBC to check for anemia or eosinophils, CMP collected  Recommended GI referral for possible EGD, referral submitted         Relevant Orders   CBC w/Diff/Platelet   Comprehensive metabolic panel   TSH + free T4   Ambulatory referral to Gastroenterology     Other   Establishing care with new doctor, encounter for    Welcomed patient to [facility]  Reviewed patient's medical history, medications, surgical and social history Discussed roles and expectations for primary care physician-patient relationship Recommended patient schedule annual preventative examinations        Elevated TSH    TSH of 14 reviewed from 2014  Will repeat levels today      Relevant Orders   TSH + free T4   Healthcare maintenance    Hepatitis C & HIV screenings ordered today        Other Visit Diagnoses     Screening for HIV (human immunodeficiency virus)       Relevant Orders   HIV Antibody (routine testing w rflx)   Encounter for hepatitis C screening test for low risk patient        Relevant Orders   Hepatitis C antibody        Return in about 1 month (around 12/12/2022) for dysphagia .       The entirety of the information documented in the History of Present Illness, Review of Systems and Physical Exam were personally obtained by me. Portions of this information were initially documented by Hetty Ely, CMA . I, Ronnald Ramp, MD have reviewed the documentation above for thoroughness and accuracy.   Ronnald Ramp, MD  [facility] [phone] (phone) [phone] (fax)  [facility]

## 2022-11-11 NOTE — Assessment & Plan Note (Signed)
Welcomed patient to [facility]  Reviewed patient's medical history, medications, surgical and social history Discussed roles and expectations for primary care physician-patient relationship Recommended patient schedule annual preventative examinations   

## 2022-11-12 ENCOUNTER — Other Ambulatory Visit: Payer: Self-pay

## 2022-11-12 DIAGNOSIS — E038 Other specified hypothyroidism: Secondary | ICD-10-CM | POA: Insufficient documentation

## 2022-11-12 DIAGNOSIS — R748 Abnormal levels of other serum enzymes: Secondary | ICD-10-CM

## 2022-11-12 LAB — CBC WITH DIFFERENTIAL/PLATELET
Basophils Absolute: 0.1 10*3/uL (ref 0.0–0.2)
Basos: 1 %
EOS (ABSOLUTE): 0.4 10*3/uL (ref 0.0–0.4)
Eos: 8 %
Hematocrit: 41.9 % (ref 37.5–51.0)
Hemoglobin: 14 g/dL (ref 13.0–17.7)
Immature Grans (Abs): 0 10*3/uL (ref 0.0–0.1)
Immature Granulocytes: 0 %
Lymphocytes Absolute: 0.9 10*3/uL (ref 0.7–3.1)
Lymphs: 19 %
MCH: 27.7 pg (ref 26.6–33.0)
MCHC: 33.4 g/dL (ref 31.5–35.7)
MCV: 83 fL (ref 79–97)
Monocytes Absolute: 0.3 10*3/uL (ref 0.1–0.9)
Monocytes: 7 %
Neutrophils Absolute: 3.1 10*3/uL (ref 1.4–7.0)
Neutrophils: 65 %
Platelets: 244 10*3/uL (ref 150–450)
RBC: 5.06 x10E6/uL (ref 4.14–5.80)
RDW: 13 % (ref 11.6–15.4)
WBC: 4.7 10*3/uL (ref 3.4–10.8)

## 2022-11-12 LAB — COMPREHENSIVE METABOLIC PANEL
ALT: 48 IU/L — ABNORMAL HIGH (ref 0–44)
AST: 53 IU/L — ABNORMAL HIGH (ref 0–40)
Albumin/Globulin Ratio: 2 (ref 1.2–2.2)
Albumin: 4.5 g/dL (ref 4.3–5.2)
Alkaline Phosphatase: 73 IU/L (ref 44–121)
BUN/Creatinine Ratio: 14 (ref 9–20)
BUN: 15 mg/dL (ref 6–20)
Bilirubin Total: 0.2 mg/dL (ref 0.0–1.2)
CO2: 22 mmol/L (ref 20–29)
Calcium: 8.8 mg/dL (ref 8.7–10.2)
Chloride: 105 mmol/L (ref 96–106)
Creatinine, Ser: 1.04 mg/dL (ref 0.76–1.27)
Globulin, Total: 2.3 g/dL (ref 1.5–4.5)
Glucose: 91 mg/dL (ref 70–99)
Potassium: 4.5 mmol/L (ref 3.5–5.2)
Sodium: 142 mmol/L (ref 134–144)
Total Protein: 6.8 g/dL (ref 6.0–8.5)
eGFR: 102 mL/min/{1.73_m2} (ref >59)

## 2022-11-12 LAB — HEPATITIS C ANTIBODY: Hep C Virus Ab: NONREACTIVE

## 2022-11-12 LAB — TSH+FREE T4
Free T4: 1.29 ng/dL (ref 0.82–1.77)
TSH: 68.4 u[IU]/mL — ABNORMAL HIGH (ref 0.450–4.500)

## 2022-11-12 LAB — SPECIMEN STATUS REPORT

## 2022-11-12 LAB — HIV ANTIBODY (ROUTINE TESTING W REFLEX): HIV Screen 4th Generation wRfx: NONREACTIVE

## 2022-11-12 MED ORDER — LEVOTHYROXINE SODIUM 112 MCG PO TABS
112.0000 ug | ORAL_TABLET | Freq: Every day | ORAL | 1 refills | Status: DC
Start: 2022-11-12 — End: 2023-06-10

## 2022-11-12 NOTE — Addendum Note (Signed)
Addended by: Bing Neighbors on: 11/12/2022 11:37 AM   Modules accepted: Orders

## 2023-04-22 ENCOUNTER — Ambulatory Visit: Payer: BC Managed Care – PPO | Admitting: Family Medicine

## 2023-04-22 ENCOUNTER — Encounter: Payer: Self-pay | Admitting: Family Medicine

## 2023-04-22 VITALS — BP 115/71 | HR 70 | Ht 67.0 in | Wt 182.5 lb

## 2023-04-22 DIAGNOSIS — E039 Hypothyroidism, unspecified: Secondary | ICD-10-CM | POA: Diagnosis not present

## 2023-04-22 DIAGNOSIS — E038 Other specified hypothyroidism: Secondary | ICD-10-CM

## 2023-04-22 DIAGNOSIS — R748 Abnormal levels of other serum enzymes: Secondary | ICD-10-CM | POA: Diagnosis not present

## 2023-04-22 DIAGNOSIS — Z0000 Encounter for general adult medical examination without abnormal findings: Secondary | ICD-10-CM

## 2023-04-22 DIAGNOSIS — R7989 Other specified abnormal findings of blood chemistry: Secondary | ICD-10-CM

## 2023-04-22 NOTE — Progress Notes (Signed)
Established patient visit   Patient: Bill [name]   DOB: 1996/06/04   27 y.o. Male  MRN: [id number] Visit Date: 04/22/2023  Today's healthcare provider: Ronnald Ramp, MD   Chief Complaint  Patient presents with   Medical Management of Chronic Issues    5 month thyroid follow-up. Patient reports he has had changes in his energy level (more energetic) and weight changes (putting on more weight)   Immunizations    Patient has declined influenza, tetanus and HPV vaccines.   Subjective     HPI     Medical Management of Chronic Issues    Additional comments: 5 month thyroid follow-up. Patient reports he has had changes in his energy level (more energetic) and weight changes (putting on more weight)        Immunizations    Additional comments: Patient has declined influenza, tetanus and HPV vaccines.      Last edited by Acey Lav, CMA on 04/22/2023  1:32 PM.       Discussed the use of AI scribe software for clinical note transcription with the patient, who gave verbal consent to proceed.  History of Present Illness   The patient, a 27 year old male with a history of abnormal thyroid levels, presents for a follow-up visit. He has been on levothyroxine 112 mcg daily for a chronic thyroid issue. The patient reports increased energy since starting the medication, but also notes weight gain. He admits to inconsistent medication adherence due to occasional forgetfulness, but estimates missing only eight days since the initial prescription.  The patient has been trying to lose weight, but finds it challenging due to dietary habits and a sedentary lifestyle. He reports eating once a day and recently resumed weight lifting and cardio exercises two weeks ago. 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  The patient has quit  vaping completely, but admits to daily use of nicotine pouches. He expresses a desire to quit this habit as well, but finds it challenging.  The patient also mentions a history of fatty liver tissue and previous heavy alcohol use. He reports a significant reduction in alcohol consumption, currently drinking three to five beers per week.         Past Medical History:  Diagnosis Date   Bleeding disorder (HCC)    Hereditary hemorrhagic telangiectasia (HCC)     Medications: Outpatient Medications Prior to Visit  Medication Sig   acetaminophen (TYLENOL) 325 MG tablet Take 1-2 tablets (325-650 mg total) by mouth every 6 (six) hours as needed for mild pain, moderate pain, fever or headache (or Fever >/= 101).   gentamicin ointment (GARAMYCIN) 0.1 % Apply topically 3 (three) times daily.   levothyroxine (SYNTHROID) 112 MCG tablet Take 1 tablet (112 mcg total) by mouth daily.   No facility-administered medications prior to visit.    Review of Systems  Last CBC Lab Results  Component Value Date   WBC 4.7 11/11/2022   HGB 14.0 11/11/2022   HCT 41.9 11/11/2022   MCV 83 11/11/2022   MCH 27.7 11/11/2022   RDW 13.0 11/11/2022   PLT 244 11/11/2022   Last metabolic panel Lab Results  Component Value Date   GLUCOSE 91 11/11/2022   NA 142 11/11/2022   K 4.5 11/11/2022   CL 105 11/11/2022   CO2 22 11/11/2022   BUN 15 11/11/2022  CREATININE 1.04 11/11/2022   EGFR 102 11/11/2022   CALCIUM 8.8 11/11/2022   PROT 6.8 11/11/2022   ALBUMIN 4.5 11/11/2022   LABGLOB 2.3 11/11/2022   AGRATIO 2.0 11/11/2022   BILITOT 0.2 11/11/2022   ALKPHOS 73 11/11/2022   AST 53 (H) 11/11/2022   ALT 48 (H) 11/11/2022   ANIONGAP 14 11/24/2017   Last hemoglobin A1c No results found for: "HGBA1C" Last thyroid functions Lab Results  Component Value Date   TSH 68.400 (H) 11/11/2022         Objective    BP 115/71 (BP Location: Right Arm, Patient Position: Sitting, Cuff Size: Normal)   Pulse 70    Ht 5\' 7"  (1.702 m)   Wt 182 lb 8 oz (82.8 kg)   SpO2 98%   BMI 28.58 kg/m  BP Readings from Last 3 Encounters:  04/22/23 115/71  11/11/22 116/77  02/01/18 102/60   Wt Readings from Last 3 Encounters:  04/22/23 182 lb 8 oz (82.8 kg)  11/11/22 181 lb 8 oz (82.3 kg)  02/01/18 165 lb (74.8 kg)        Wt Readings from Last 3 Encounters:  04/22/23 182 lb 8 oz (82.8 kg)  11/11/22 181 lb 8 oz (82.3 kg)  02/01/18 165 lb (74.8 kg)   BP Readings from Last 3 Encounters:  04/22/23 115/71  11/11/22 116/77  02/01/18 102/60     Physical Exam  Physical Exam   HEENT: Thyroid gland not enlarged, not tender, no nodules palpated. CHEST: Lungs clear to auscultation. CARDIOVASCULAR: Heart rhythm regular, no murmurs detected.      No results found for any visits on 04/22/23.  Assessment & Plan     Problem List Items Addressed This Visit       Endocrine   Subclinical hypothyroidism - Primary   Relevant Orders   TSH+T4F+T3Free     Other   Healthcare maintenance    Declined influenza vaccine      Elevated TSH    On Levothyroxine 112 mcg daily with reported increased energy but also weight gain. Adherence to medication is mostly consistent with occasional missed doses. Chronic -Order TSH, T4, T3, and TPO antibodies to assess thyroid function and potential autoimmune etiology. -Continue Levothyroxine 112 mcg daily.      Relevant Orders   TSH+T4F+T3Free   Thyroid peroxidase antibody   Other Visit Diagnoses     Elevated liver enzymes       Relevant Orders   Comprehensive metabolic panel            Weight Gain Patient reports recent weight gain, inconsistent diet, and recent return to exercise regimen. -Encourage consistent healthy diet and regular exercise. -will check TSH levels to ensure the correct dose of Levothyroxine  Nicotine Use Patient has quit vaping but continues to use nicotine pouches. -Encourage cessation of nicotine pouch use due to potential  oral and throat cancer risks. -congratulated patient on being able to quit vaping  Alcohol Use Patient reports moderate alcohol consumption (3-5 beers per week), previously heavier use. -Encourage moderation in alcohol consumption, no more than 14 drinks per week. -recheck CMP  Elevated Liver Enzymes Previous slightly elevated ALT and AST, potential fatty liver. -Order complete metabolic panel to assess current liver function.     Return in about 5 months (around 09/20/2023) for CPE,thyroid.         Ronnald Ramp, MD  [facility] [phone] (phone) [phone] (fax)  [facility]

## 2023-04-22 NOTE — Assessment & Plan Note (Signed)
On Levothyroxine 112 mcg daily with reported increased energy but also weight gain. Adherence to medication is mostly consistent with occasional missed doses. Chronic -Order TSH, T4, T3, and TPO antibodies to assess thyroid function and potential autoimmune etiology. -Continue Levothyroxine 112 mcg daily.

## 2023-04-22 NOTE — Assessment & Plan Note (Signed)
Declined influenza vaccine

## 2023-04-23 LAB — COMPREHENSIVE METABOLIC PANEL
ALT: 54 [IU]/L — ABNORMAL HIGH (ref 0–44)
AST: 50 [IU]/L — ABNORMAL HIGH (ref 0–40)
Albumin: 4.5 g/dL (ref 4.3–5.2)
Alkaline Phosphatase: 65 [IU]/L (ref 44–121)
BUN/Creatinine Ratio: 11 (ref 9–20)
BUN: 13 mg/dL (ref 6–20)
Bilirubin Total: 0.6 mg/dL (ref 0.0–1.2)
CO2: 23 mmol/L (ref 20–29)
Calcium: 9.4 mg/dL (ref 8.7–10.2)
Chloride: 105 mmol/L (ref 96–106)
Creatinine, Ser: 1.21 mg/dL (ref 0.76–1.27)
Globulin, Total: 2.4 g/dL (ref 1.5–4.5)
Glucose: 84 mg/dL (ref 70–99)
Potassium: 4.3 mmol/L (ref 3.5–5.2)
Sodium: 141 mmol/L (ref 134–144)
Total Protein: 6.9 g/dL (ref 6.0–8.5)
eGFR: 85 mL/min/{1.73_m2} (ref >59)

## 2023-04-23 LAB — THYROID PEROXIDASE ANTIBODY: Thyroperoxidase Ab SerPl-aCnc: 11 [IU]/mL (ref 0–34)

## 2023-04-23 LAB — TSH+T4F+T3FREE
Free T4: 1.93 ng/dL — ABNORMAL HIGH (ref 0.82–1.77)
T3, Free: 3.4 pg/mL (ref 2.0–4.4)
TSH: 13.8 u[IU]/mL — ABNORMAL HIGH (ref 0.450–4.500)

## 2023-04-24 DIAGNOSIS — E039 Hypothyroidism, unspecified: Secondary | ICD-10-CM | POA: Insufficient documentation

## 2023-04-24 NOTE — Addendum Note (Signed)
Addended by: Bing Neighbors on: 04/24/2023 08:25 AM   Modules accepted: Orders

## 2023-06-10 ENCOUNTER — Other Ambulatory Visit: Payer: Self-pay | Admitting: Family Medicine

## 2023-06-10 NOTE — Telephone Encounter (Signed)
Referral appointment 07/07/23 Requested Prescriptions  Pending Prescriptions Disp Refills   levothyroxine (SYNTHROID) 112 MCG tablet [Pharmacy Med Name: LEVOTHYROXINE 0.112MG  ( ) TABS] 90 tablet 1    Sig: TAKE 1 TABLET(112 MCG) BY MOUTH DAILY     Endocrinology:  Hypothyroid Agents Failed - 06/10/2023 11:13 AM      Failed - TSH in normal range and within 360 days    TSH  Date Value Ref Range Status  04/22/2023 13.800 (H) 0.450 - 4.500 uIU/mL Final         Passed - Valid encounter within last 12 months    Recent Outpatient Visits           1 month ago Acquired hypothyroidism   [facility] Simmons-Robinson, Vernon, MD   7 months ago Oropharyngeal dysphagia   [facility] Simmons-Robinson, Tawanna Cooler, MD       Future Appointments             In 3 months Simmons-Robinson, Tawanna Cooler, MD [facility]

## 2023-08-22 ENCOUNTER — Other Ambulatory Visit: Payer: Self-pay | Admitting: Family Medicine

## 2023-08-25 NOTE — Telephone Encounter (Signed)
 LOV 04/22/2023 NOV 09/21/2023 LRF 06/10/2023 LABS 07/07/2023

## 2023-08-27 MED ORDER — LEVOTHYROXINE SODIUM 112 MCG PO TABS: 112.0000 ug | ORAL_TABLET | Freq: Every day | ORAL | 1 refills | Status: DC

## 2023-09-21 ENCOUNTER — Encounter: Payer: Self-pay | Admitting: Family Medicine

## 2023-10-23 ENCOUNTER — Ambulatory Visit (INDEPENDENT_AMBULATORY_CARE_PROVIDER_SITE_OTHER): Payer: Self-pay | Admitting: Family Medicine

## 2023-10-23 ENCOUNTER — Encounter: Payer: Self-pay | Admitting: Family Medicine

## 2023-10-23 VITALS — BP 123/80 | HR 76 | Ht 67.0 in | Wt 182.0 lb

## 2023-10-23 DIAGNOSIS — Z13220 Encounter for screening for lipoid disorders: Secondary | ICD-10-CM

## 2023-10-23 DIAGNOSIS — Z131 Encounter for screening for diabetes mellitus: Secondary | ICD-10-CM

## 2023-10-23 DIAGNOSIS — Z0000 Encounter for general adult medical examination without abnormal findings: Secondary | ICD-10-CM | POA: Diagnosis not present

## 2023-10-23 DIAGNOSIS — E039 Hypothyroidism, unspecified: Secondary | ICD-10-CM

## 2023-10-23 DIAGNOSIS — Z130 Encounter for screening for diseases of the blood and blood-forming organs and certain disorders involving the immune mechanism: Secondary | ICD-10-CM | POA: Diagnosis not present

## 2023-10-23 NOTE — Progress Notes (Signed)
 Complete physical exam   Patient: Bill [name]   DOB: September 19, 1995   27 y.o. Male  MRN: [id number] Visit Date: 10/23/2023  Today's healthcare provider: Mimi Alt, MD   Chief Complaint  Patient presents with   Annual Exam    No concerns   Subjective    Bill [name] is a 28 y.o. male who presents today for a complete physical exam.   He reports consuming a general diet.   Gym/ health club routine includes cardio and mod to heavy weightlifting.    He does not have additional problems to discuss today.   Discussed the use of AI scribe software for clinical note transcription with the patient, who gave verbal consent to proceed.  History of Present Illness Bill [name] is a 28 year old male who presents for an annual physical exam.  He has a history of hypothyroidism and is under the care of an endocrinologist, with his last visit on January 14th and a follow-up scheduled for May 29th.  He exercises regularly, attending the gym about six days a week for sessions lasting an hour to an hour and fifteen minutes, although he has reduced his exercise frequency in the past two to three weeks. His diet has improved with less fast food consumption and more chicken and rice, as his brother has put him on a calorie deficit diet.  He has quit vaping since October but is currently using nicotine packets, which he is trying to quit. 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 He drinks alcohol occasionally, consuming a couple of beers or shots per week.  No significant findings were reported during the review of systems.     Past Medical History:  Diagnosis Date   Bleeding disorder (HCC)    Hereditary hemorrhagic telangiectasia (HCC)    Past Surgical History:  Procedure Laterality Date   COLONOSCOPY WITH PROPOFOL  N/A 02/01/2018   Procedure: COLONOSCOPY WITH PROPOFOL ;   Surgeon: Deveron Fly, MD;  Location: [facility] ENDOSCOPY;  Service: Endoscopy;  Laterality: N/A;   TONSILLECTOMY     Social History   Socioeconomic History   Marital status: Single    Spouse name: Not on file   Number of children: Not on file   Years of education: Not on file   Highest education level: Not on file  Occupational History   Not on file  Tobacco Use   Smoking status: Never   Smokeless tobacco: Current  Vaping Use   Vaping status: Every Day   Start date: 10/22/2018  Substance and Sexual Activity   Alcohol use: Yes    Alcohol/week: 5.0 standard drinks of alcohol    Types: 2 Cans of beer, 3 Shots of liquor per week   Drug use: Never   Sexual activity: Not Currently  Other Topics Concern   Not on file  Social History Narrative   Not on file   Social Drivers of Health   Financial Resource Strain: Low Risk  (10/23/2023)   Overall Financial Resource Strain (CARDIA)    Difficulty of Paying Living Expenses: Not hard at all  Food Insecurity: No Food Insecurity (10/23/2023)   Hunger Vital Sign    Worried About Running Out of Food in the Last Year: Never true    Ran Out of Food in the Last Year: Never true  Transportation Needs: No Transportation Needs (10/23/2023)   PRAPARE - Transportation  Lack of Transportation (Medical): No    Lack of Transportation (Non-Medical): No  Physical Activity: Not on file  Stress: No Stress Concern Present (10/23/2023)   [employer] of Occupational Health - Occupational Stress Questionnaire    Feeling of Stress : Not at all  Social Connections: Not on file  Intimate Partner Violence: Not At Risk (10/23/2023)   Humiliation, Afraid, Rape, and Kick questionnaire    Fear of Current or Ex-Partner: No    Emotionally Abused: No    Physically Abused: No    Sexually Abused: No   Family Status  Relation Name Status   Mother  Alive   Father  Unknown  No partnership data on file   History reviewed. No pertinent family history. Allergies   Allergen Reactions   Penicillins Hives     Medications: Outpatient Medications Prior to Visit  Medication Sig   acetaminophen  (TYLENOL ) 325 MG tablet Take 1-2 tablets (325-650 mg total) by mouth every 6 (six) hours as needed for mild pain, moderate pain, fever or headache (or Fever >/= 101).   gentamicin ointment (GARAMYCIN) 0.1 % Apply topically 3 (three) times daily.   levothyroxine  (SYNTHROID ) 112 MCG tablet Take 1 tablet (112 mcg total) by mouth daily before breakfast.   No facility-administered medications prior to visit.    Review of Systems  Last CBC Lab Results  Component Value Date   WBC 4.7 11/11/2022   HGB 14.0 11/11/2022   HCT 41.9 11/11/2022   MCV 83 11/11/2022   MCH 27.7 11/11/2022   RDW 13.0 11/11/2022   PLT 244 11/11/2022   Last metabolic panel Lab Results  Component Value Date   GLUCOSE 84 04/22/2023   NA 141 04/22/2023   K 4.3 04/22/2023   CL 105 04/22/2023   CO2 23 04/22/2023   BUN 13 04/22/2023   CREATININE 1.21 04/22/2023   EGFR 85 04/22/2023   CALCIUM 9.4 04/22/2023   PROT 6.9 04/22/2023   ALBUMIN 4.5 04/22/2023   LABGLOB 2.4 04/22/2023   AGRATIO 2.0 11/11/2022   BILITOT 0.6 04/22/2023   ALKPHOS 65 04/22/2023   AST 50 (H) 04/22/2023   ALT 54 (H) 04/22/2023   ANIONGAP 14 11/24/2017   Last lipids No results found for: "CHOL", "HDL", "LDLCALC", "LDLDIRECT", "TRIG", "CHOLHDL" Last hemoglobin A1c No results found for: "HGBA1C" Last thyroid  functions Lab Results  Component Value Date   TSH 13.800 (H) 04/22/2023       Objective    BP 123/80   Pulse 76   Ht 5\' 7"  (1.702 m)   Wt 182 lb (82.6 kg)   SpO2 100%   BMI 28.51 kg/m  BP Readings from Last 3 Encounters:  10/23/23 123/80  04/22/23 115/71  11/11/22 116/77   Wt Readings from Last 3 Encounters:  10/23/23 182 lb (82.6 kg)  04/22/23 182 lb 8 oz (82.8 kg)  11/11/22 181 lb 8 oz (82.3 kg)        Physical Exam Vitals reviewed.  Constitutional:      General: He is not in  acute distress.    Appearance: Normal appearance. He is not ill-appearing, toxic-appearing or diaphoretic.  HENT:     Head: Normocephalic and atraumatic.     Right Ear: Tympanic membrane and external ear normal.     Left Ear: Tympanic membrane and external ear normal.     Nose: Nose normal.     Mouth/Throat:     Mouth: Mucous membranes are moist.     Pharynx: No oropharyngeal exudate or  posterior oropharyngeal erythema.  Eyes:     General: No scleral icterus.    Extraocular Movements: Extraocular movements intact.     Conjunctiva/sclera: Conjunctivae normal.     Pupils: Pupils are equal, round, and reactive to light.  Neck:     Thyroid : No thyroid  mass, thyromegaly or thyroid  tenderness.     Vascular: No carotid bruit.  Cardiovascular:     Rate and Rhythm: Normal rate and regular rhythm.     Pulses: Normal pulses.     Heart sounds: Normal heart sounds. No murmur heard.    No friction rub. No gallop.  Pulmonary:     Effort: Pulmonary effort is normal. No respiratory distress.     Breath sounds: Normal breath sounds. No stridor. No wheezing, rhonchi or rales.  Abdominal:     General: Bowel sounds are normal. There is no distension.     Palpations: Abdomen is soft.     Tenderness: There is no abdominal tenderness.  Musculoskeletal:        General: No swelling, tenderness or signs of injury. Normal range of motion.     Cervical back: Normal range of motion and neck supple. No erythema, rigidity or tenderness.     Right lower leg: No edema.     Left lower leg: No edema.  Lymphadenopathy:     Cervical: No cervical adenopathy.  Skin:    General: Skin is warm and dry.     Capillary Refill: Capillary refill takes less than 2 seconds.     Findings: No erythema or rash.  Neurological:     General: No focal deficit present.     Mental Status: He is alert and oriented to person, place, and time.     Cranial Nerves: Cranial nerves 2-12 are intact.     Sensory: Sensation is intact.      Motor: Motor function is intact. No weakness, tremor or abnormal muscle tone.     Gait: Gait normal.  Psychiatric:        Attention and Perception: Attention normal.        Mood and Affect: Mood normal.        Speech: Speech normal.        Behavior: Behavior normal. Behavior is cooperative.        Thought Content: Thought content normal.       Last depression screening scores    10/23/2023    9:43 AM 04/22/2023    1:33 PM 11/11/2022    9:51 AM  PHQ 2/9 Scores  PHQ - 2 Score 1 1 0  PHQ- 9 Score 3      Last fall risk screening    04/22/2023    1:33 PM  Fall Risk   Falls in the past year? 0  Number falls in past yr: 0  Injury with Fall? 0  Risk for fall due to : No Fall Risks  Follow up Falls evaluation completed    Last Audit-C alcohol use screening    10/23/2023    9:42 AM  Alcohol Use Disorder Test (AUDIT)  1. How often do you have a drink containing alcohol? 1  2. How many drinks containing alcohol do you have on a typical day when you are drinking? 0  3. How often do you have six or more drinks on one occasion? 0  AUDIT-C Score 1   A score of 3 or more in women, and 4 or more in men indicates increased risk for alcohol abuse, EXCEPT if  all of the points are from question 1   No results found for any visits on 10/23/23.  Assessment & Plan    Routine Health Maintenance and Physical Exam   There is no immunization history on file for this patient.  Health Maintenance  Topic Date Due   HPV VACCINES (1 - Male 3-dose series) Never done   DTaP/Tdap/Td (1 - Tdap) Never done   COVID-19 Vaccine (1 - 2024-25 season) Never done   INFLUENZA VACCINE  01/22/2024   Hepatitis C Screening  Completed   HIV Screening  Completed   Meningococcal B Vaccine  Aged Out    Problem List Items Addressed This Visit       Endocrine   Acquired hypothyroidism   Other Visit Diagnoses       Annual physical exam    -  Primary     Screening for deficiency anemia         Screening  for lipid disorders         Screening for diabetes mellitus           Assessment & Plan Wellness Visit Annual wellness visit conducted. Blood pressure is 123/80 mmHg. He has reduced gym attendance over the past 2-3 weeks but maintains healthy eating habits. Alcohol consumption is moderate, with a few beers or shots per week. He has quit vaping but uses nicotine packets, which he is attempting to quit. He declined HPV and tetanus vaccinations despite discussion on his benefits, including protection against genital/oral cancers and tetanus-related complications. - Recommend 150 minutes of exercise per week. - Encourage a well-balanced diet. - Order CBC, A1c, and cholesterol labs.  Hypothyroidism chronic Hypothyroidism is managed by endocrinology. Follow-up with endocrinologist Dr. Solum is scheduled for May 29th. - Continue management with endocrinology. - continue synthroid         No follow-ups on file.       Mimi Alt, MD  [facility] [phone] (phone) [phone] (fax)  [facility]

## 2023-10-24 LAB — CBC
Hematocrit: 45.1 % (ref 37.5–51.0)
Hemoglobin: 15.1 g/dL (ref 13.0–17.7)
MCH: 28.6 pg (ref 26.6–33.0)
MCHC: 33.5 g/dL (ref 31.5–35.7)
MCV: 85 fL (ref 79–97)
Platelets: 271 10*3/uL (ref 150–450)
RBC: 5.28 x10E6/uL (ref 4.14–5.80)
RDW: 12.1 % (ref 11.6–15.4)
WBC: 4.8 10*3/uL (ref 3.4–10.8)

## 2023-10-24 LAB — HEMOGLOBIN A1C
Est. average glucose Bld gHb Est-mCnc: 108 mg/dL
Hgb A1c MFr Bld: 5.4 % (ref 4.8–5.6)

## 2023-10-24 LAB — LIPID PANEL
Chol/HDL Ratio: 3.9 ratio (ref 0.0–5.0)
Cholesterol, Total: 170 mg/dL (ref 100–199)
HDL: 44 mg/dL (ref >39)
LDL Chol Calc (NIH): 113 mg/dL — ABNORMAL HIGH (ref 0–99)
Triglycerides: 70 mg/dL (ref 0–149)
VLDL Cholesterol Cal: 13 mg/dL (ref 5–40)

## 2023-11-02 ENCOUNTER — Encounter: Payer: Self-pay | Admitting: Family Medicine

## 2024-04-22 ENCOUNTER — Ambulatory Visit: Admitting: Family Medicine

## 2024-04-22 VITALS — BP 131/85 | HR 82 | Temp 99.0°F | Ht 67.0 in | Wt 198.6 lb

## 2024-04-22 DIAGNOSIS — G8929 Other chronic pain: Secondary | ICD-10-CM

## 2024-04-22 DIAGNOSIS — I780 Hereditary hemorrhagic telangiectasia: Secondary | ICD-10-CM

## 2024-04-22 DIAGNOSIS — E039 Hypothyroidism, unspecified: Secondary | ICD-10-CM

## 2024-04-22 DIAGNOSIS — M25562 Pain in left knee: Secondary | ICD-10-CM | POA: Diagnosis not present

## 2024-04-22 DIAGNOSIS — M549 Dorsalgia, unspecified: Secondary | ICD-10-CM

## 2024-04-22 MED ORDER — BACLOFEN 10 MG PO TABS: 10.0000 mg | ORAL_TABLET | Freq: Three times a day (TID) | ORAL | 0 refills | Status: AC

## 2024-04-22 MED ORDER — LEVOTHYROXINE SODIUM 112 MCG PO TABS: 112.0000 ug | ORAL_TABLET | Freq: Every day | ORAL | 1 refills | Status: AC

## 2024-04-22 NOTE — Patient Instructions (Signed)
 To keep you healthy, please keep in mind the following health maintenance items that you are due for:   Health Maintenance Due  Topic Date Due   DTaP/Tdap/Td (7 - Td or Tdap) 11/03/2018   Influenza Vaccine  Never done     Best Wishes,   Dr. Lang

## 2024-04-22 NOTE — Progress Notes (Signed)
 Established patient visit   Patient: Bill [name]   DOB: 07-Mar-1996   28 y.o. Male  MRN: [id number] Visit Date: 04/22/2024  Today's healthcare provider: Rockie Agent, MD   Chief Complaint  Patient presents with   Form Completion    Patient is present to discuss FMLA forms. Would like this completed for back, knee pain and bleeding disorder. 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 Mainly left knee pain with extension and weight bearing. Patient states not a daily issue but does happen aboout 6 days out of the month    Back Pain    Lower back Not daily pain but about 6 days out of the month. Pain with bending   Subjective     HPI     Form Completion    Additional comments: Patient is present to discuss FMLA forms. Would like this completed for back, knee pain and bleeding disorder. 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 Mainly left knee pain with extension and weight bearing. Patient states not a daily issue but does happen aboout 6 days out of the month         Back Pain    Additional comments: Lower back Not daily pain but about 6 days out of the month. Pain with bending      Last edited by Cherry Chiquita HERO, CMA on 04/22/2024  4:04 PM.       Discussed the use of AI scribe software for clinical note transcription with the patient, who gave verbal consent to proceed.  History of Present Illness Bill [name] is a 28 year old male with hereditary hemorrhagic telangiectasia who presents with knee and back pain.  He experiences left knee pain primarily with extension and weight bearing, attributed to playing sports. The pain has been present for over a year,  occurring approximately six days per month. It is described as sharp, starting at the bottom of the knee and working its way up, mainly on the side of the knee. He has not had this evaluated before and notes that it is not a daily issue.  He also has lower back pain, occurring about six days per month, particularly with bending. The pain is described as an ache located on the left side from the lower thoracic to the lumbar region, without associated numbness or tingling in the legs. 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  He has a history of hereditary hemorrhagic telangiectasia, leading to recurrent epistaxis. He experiences epistaxis almost daily, varying in duration from 30 seconds to several hours. He has not seen a hematologist recently but was previously evaluated and found negative for clotting disorders. He used to have his nose cauterized annually but has not done so in recent years due to difficulty in scheduling appointments. He has been using dactinomycin in his nose more frequently, which has reduced the frequency of epistaxis slightly.  He is currently taking Synthroid  112 mcg for thyroid  management. He also uses lidocaine patches and Blue Emu gel for pain relief occasionally.     Past  Medical History:  Diagnosis Date   Bleeding disorder    Hereditary hemorrhagic telangiectasia     Medications: Outpatient Medications Prior to Visit  Medication Sig   acetaminophen  (TYLENOL ) 325 MG tablet Take 1-2 tablets (325-650 mg total) by mouth every 6 (six) hours as needed for mild pain, moderate pain, fever or headache (or Fever >/= 101).   gentamicin ointment (GARAMYCIN) 0.1 % Apply topically 3 (three) times daily.   levothyroxine  (SYNTHROID ) 112 MCG tablet Take 1 tablet (112 mcg total) by mouth daily before breakfast.   No facility-administered medications prior to visit.    Review of Systems  Last CBC Lab Results   Component Value Date   WBC 4.8 10/23/2023   HGB 15.1 10/23/2023   HCT 45.1 10/23/2023   MCV 85 10/23/2023   MCH 28.6 10/23/2023   RDW 12.1 10/23/2023   PLT 271 10/23/2023   Last metabolic panel Lab Results  Component Value Date   GLUCOSE 84 04/22/2023   NA 141 04/22/2023   K 4.3 04/22/2023   CL 105 04/22/2023   CO2 23 04/22/2023   BUN 13 04/22/2023   CREATININE 1.21 04/22/2023   EGFR 85 04/22/2023   CALCIUM 9.4 04/22/2023   PROT 6.9 04/22/2023   ALBUMIN 4.5 04/22/2023   LABGLOB 2.4 04/22/2023   AGRATIO 2.0 11/11/2022   BILITOT 0.6 04/22/2023   ALKPHOS 65 04/22/2023   AST 50 (H) 04/22/2023   ALT 54 (H) 04/22/2023   ANIONGAP 14 11/24/2017   Last lipids Lab Results  Component Value Date   CHOL 170 10/23/2023   HDL 44 10/23/2023   LDLCALC 113 (H) 10/23/2023   TRIG 70 10/23/2023   CHOLHDL 3.9 10/23/2023   Last hemoglobin A1c Lab Results  Component Value Date   HGBA1C 5.4 10/23/2023   Last thyroid  functions Lab Results  Component Value Date   TSH 13.800 (H) 04/22/2023   FREET4 1.93 (H) 04/22/2023   THYROIDAB 11 04/22/2023   Last vitamin D No results found for: 25OHVITD2, 25OHVITD3, VD25OH Last vitamin B12 and Folate No results found for: VITAMINB12, FOLATE      Objective    BP 131/85 (BP Location: Right Arm, Patient Position: Sitting, Cuff Size: Normal)   Pulse 82   Temp 99 F (37.2 C) (Oral)   Ht 5' 7 (1.702 m)   Wt 198 lb 9.6 oz (90.1 kg)   SpO2 100%   BMI 31.11 kg/m  BP Readings from Last 3 Encounters:  04/22/24 131/85  10/23/23 123/80  04/22/23 115/71   Wt Readings from Last 3 Encounters:  04/22/24 198 lb 9.6 oz (90.1 kg)  10/23/23 182 lb (82.6 kg)  04/22/23 182 lb 8 oz (82.8 kg)        Physical Exam  Physical Exam GENERAL: Alert, cooperative, well developed, no acute distress. MUSCULOSKELETAL: Left-sided muscle spasms along cervical, thoracic, and lumbar spine. Left knee with normal range of motion, tenderness along  lateral joint line, 5/5 strength. McMurray and Lachman tests negative, medial and collateral ligaments intact.    No results found for any visits on 04/22/24.  Assessment & Plan     Problem List Items Addressed This Visit     Chronic back pain greater than 3 months duration   Chronic pain of left knee - Primary     Assessment & Plan Hereditary hemorrhagic telangiectasia with recurrent epistaxis Chronic condition with daily epistaxis, varying in duration from 30 seconds to several hours. Previous evaluations ruled out clotting disorders. No current hematology follow-up. Dactinomycin  nasal application reduces frequency of epistaxis. No cure available, but septal cauterization discussed as a potential option with uncertain efficacy. - Referred to hematology for further evaluation and management. - Continue dactinomycin nasal application as needed.  Chronic left knee pain Chronic left knee pain, likely related to sports activities and exacerbated by physical work on hard surfaces. Pain is sharp, starting at the bottom of the knee and moving upwards, with tenderness along the lateral joint line. Normal range of motion and strength. Differential includes meniscus injury or lateral collateral ligament issues. - Referred to orthopedics for further evaluation and management.  Chronic left lower back pain with muscle spasm Chronic left lower back pain with muscle spasms, primarily in the thoracic and lumbar regions. Pain is aching, without tingling or numbness. Muscle spasms noted on examination. Pain exacerbated by physical work. - Prescribed baclofen 10 mg, to be taken at night as needed for muscle spasms. - Advised use of heating pad and topical analgesics like Blue Emu for symptomatic relief.  Hypothyroidism Previous TSH levels of 68 and 13. Currently on Synthroid  112 mcg. Endocrinology follow-up scheduled for next May. - Refilled Synthroid  112 mcg prescription.     No follow-ups on  file.         Rockie Agent, MD  [facility] [phone] (phone) [phone] (fax)  [facility]

## 2024-04-25 ENCOUNTER — Encounter: Payer: Self-pay | Admitting: Family Medicine

## 2024-05-09 ENCOUNTER — Inpatient Hospital Stay

## 2024-05-09 ENCOUNTER — Inpatient Hospital Stay: Admitting: Oncology

## 2024-05-24 ENCOUNTER — Inpatient Hospital Stay

## 2024-05-24 ENCOUNTER — Inpatient Hospital Stay: Admitting: Oncology

## 2024-05-26 ENCOUNTER — Inpatient Hospital Stay

## 2024-05-26 ENCOUNTER — Encounter: Payer: Self-pay | Admitting: Oncology

## 2024-05-26 ENCOUNTER — Inpatient Hospital Stay: Attending: Oncology | Admitting: Oncology

## 2024-05-26 VITALS — BP 136/79 | HR 95 | Temp 98.6°F | Resp 20 | Wt 204.0 lb

## 2024-05-26 DIAGNOSIS — I780 Hereditary hemorrhagic telangiectasia: Secondary | ICD-10-CM | POA: Diagnosis present

## 2024-05-26 DIAGNOSIS — R040 Epistaxis: Secondary | ICD-10-CM

## 2024-05-26 NOTE — Progress Notes (Signed)
 Hematology/Oncology Consult note [phone] [phone]        REFERRING PROVIDER: Sharma Bullocks*   CHIEF COMPLAINTS/REASON FOR VISIT:  Evaluation of HHT   ASSESSMENT & PLAN:   Epistaxis, recurrent Recurrent epistaxis. Follow up with ENT.  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  - Referred to [facility] for evaluation and management. - Coordinated with genetic counseling for potential genetic testing   No orders of the defined types were placed in this encounter.   All questions were answered. The patient knows to call the clinic with any problems, questions or concerns.  Zelphia Cap, MD, PhD [facility] Hematology Oncology 05/26/2024   HISTORY OF PRESENTING ILLNESS:   Bill [name] is a  28 y.o.  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  He has experienced recurrent epistaxis since childhood, occurring randomly and sometimes multiple times a day. He recalls undergoing annual nasal cauterization. At around age 4 or 18, an MRI was performed revealing abnormal blood vessel formations. - results are not available in current EMR or care everywhere.   He was informed that his blood vessels are not properly structured, lacking vessels in his facial structure and some internal organs, leading to frequent irritation and bleeding. He does not recall undergoing genetic testing and is unaware of any family history of similar symptoms or HHT. He has not been in contact with his father's side of the family since age 65, so he is unsure of any paternal family history. His previous work up showed normal PT PTT, platelet count and function.   He  denies skin lesions and is not aware of any gastrointestinal bleeding.  He was previously seen by a pediatric hematologist at [facility], where there was a suspicion of HHT, but it is unclear if a definitive diagnosis was made or if genetic testing was completed. He recalls being referred to hematologist as a teenager.    MEDICAL HISTORY:  Past Medical History:  Diagnosis Date   Bleeding disorder    Hereditary hemorrhagic telangiectasia     SURGICAL HISTORY: Past Surgical History:  Procedure Laterality Date   COLONOSCOPY WITH PROPOFOL  N/A 02/01/2018   Procedure: COLONOSCOPY WITH PROPOFOL ;  Surgeon: Gaylyn Gladis PENNER, MD;  Location: [facility] ENDOSCOPY;  Service: Endoscopy;  Laterality: N/A;   TONSILLECTOMY      SOCIAL HISTORY: Social History   Socioeconomic History   Marital status: Single    Spouse name: Not on file   Number of children: Not on file   Years of education: Not on file   Highest education level: 12th grade  Occupational History   Not on file  Tobacco Use   Smoking status: Never   Smokeless tobacco: Current  Vaping Use   Vaping status: Every Day   Start date: 10/22/2018  Substance and Sexual Activity   Alcohol use: Not Currently    Alcohol/week: 5.0 standard drinks of alcohol    Types: 2 Cans of beer, 3 Shots of liquor per week   Drug use: Never   Sexual activity: Yes    Birth control/protection: None    Comment: Partner has a I.U.D  Other Topics Concern   Not on file  Social History Narrative  Not on file   Social Drivers of Health   Financial Resource Strain: Low Risk  (04/22/2024)   Overall Financial Resource Strain (CARDIA)    Difficulty of Paying Living Expenses: Not hard at all  Food Insecurity: No Food Insecurity (05/26/2024)   Hunger Vital Sign    Worried About Running Out of Food in the Last Year: Never true    Ran Out of Food in the Last Year: Never true  Transportation Needs: No Transportation Needs (05/26/2024)   PRAPARE -  Administrator, Civil Service (Medical): No    Lack of Transportation (Non-Medical): No  Physical Activity: Sufficiently Active (04/22/2024)   Exercise Vital Sign    Days of Exercise per Week: 5 days    Minutes of Exercise per Session: 150+ min  Stress: Stress Concern Present (04/22/2024)   [employer] of Occupational Health - Occupational Stress Questionnaire    Feeling of Stress: To some extent  Social Connections: Socially Isolated (04/22/2024)   Social Connection and Isolation Panel    Frequency of Communication with Friends and Family: More than three times a week    Frequency of Social Gatherings with Friends and Family: Twice a week    Attends Religious Services: Never    Database Administrator or Organizations: No    Attends Engineer, Structural: Not on file    Marital Status: Never married  Intimate Partner Violence: Not At Risk (05/26/2024)   Humiliation, Afraid, Rape, and Kick questionnaire    Fear of Current or Ex-Partner: No    Emotionally Abused: No    Physically Abused: No    Sexually Abused: No    FAMILY HISTORY: History reviewed. No pertinent family history.  ALLERGIES:  is allergic to penicillins.  MEDICATIONS:  Current Outpatient Medications  Medication Sig Dispense Refill   acetaminophen  (TYLENOL ) 325 MG tablet Take 1-2 tablets (325-650 mg total) by mouth every 6 (six) hours as needed for mild pain, moderate pain, fever or headache (or Fever >/= 101).     baclofen  (LIORESAL ) 10 MG tablet Take 1 tablet (10 mg total) by mouth 3 (three) times daily. 30 each 0   gentamicin ointment (GARAMYCIN) 0.1 % Apply topically 3 (three) times daily.     levothyroxine  (SYNTHROID ) 112 MCG tablet Take 1 tablet (112 mcg total) by mouth daily before breakfast. 90 tablet 1   No current facility-administered medications for this visit.    Review of Systems  Constitutional:  Negative for appetite change, chills, fatigue and fever.  HENT:   Negative for  hearing loss and voice change.   Eyes:  Negative for eye problems.  Respiratory:  Negative for chest tightness and cough.   Cardiovascular:  Negative for chest pain.  Gastrointestinal:  Negative for abdominal distention, abdominal pain and blood in stool.  Endocrine: Negative for hot flashes.  Genitourinary:  Negative for difficulty urinating and frequency.   Musculoskeletal:  Negative for arthralgias.  Skin:  Negative for itching and rash.  Neurological:  Negative for extremity weakness.  Hematological:  Negative for adenopathy.       Nose bleeding  Psychiatric/Behavioral:  Negative for confusion.    PHYSICAL EXAMINATION:  Vitals:   05/26/24 0946  BP: 136/79  Pulse: 95  Resp: 20  Temp: 98.6 F (37 C)  SpO2: 100%   Filed Weights   05/26/24 0946  Weight: 204 lb (92.5 kg)    Physical Exam Constitutional:      General: He is not in acute distress.  HENT:     Head: Normocephalic and atraumatic.  Eyes:     General: No scleral icterus. Cardiovascular:     Rate and Rhythm: Normal rate and regular rhythm.  Pulmonary:     Effort: Pulmonary effort is normal. No respiratory distress.     Breath sounds: Normal breath sounds. No wheezing.  Abdominal:     General: Bowel sounds are normal. There is no distension.     Palpations: Abdomen is soft.  Musculoskeletal:        General: No deformity. Normal range of motion.     Cervical back: Normal range of motion and neck supple.  Skin:    General: Skin is warm and dry.     Findings: No erythema or rash.  Neurological:     Mental Status: He is alert and oriented to person, place, and time. Mental status is at baseline.  Psychiatric:        Mood and Affect: Mood normal.     LABORATORY DATA:  I have reviewed the data as listed    Latest Ref Rng & Units 10/23/2023   10:01 AM 11/11/2022   12:00 AM 11/26/2017   12:04 PM  CBC  WBC 3.4 - 10.8 x10E3/uL 4.8  4.7  10.3   Hemoglobin 13.0 - 17.7 g/dL 84.8  85.9  86.6   Hematocrit 37.5 -  51.0 % 45.1  41.9  39.1   Platelets 150 - 450 x10E3/uL 271  244  215       Latest Ref Rng & Units 04/22/2023    2:03 PM 11/11/2022   12:00 AM 11/24/2017   10:41 PM  CMP  Glucose 70 - 99 mg/dL 84  91  93   BUN 6 - 20 mg/dL 13  15  13    Creatinine 0.76 - 1.27 mg/dL 8.78  8.95  9.03   Sodium 134 - 144 mmol/L 141  142  134   Potassium 3.5 - 5.2 mmol/L 4.3  4.5  3.9   Chloride 96 - 106 mmol/L 105  105  96   CO2 20 - 29 mmol/L 23  22  24    Calcium 8.7 - 10.2 mg/dL 9.4  8.8  9.5   Total Protein 6.0 - 8.5 g/dL 6.9  6.8  8.5   Total Bilirubin 0.0 - 1.2 mg/dL 0.6  0.2  1.3   Alkaline Phos 44 - 121 IU/L 65  73  60   AST 0 - 40 IU/L 50  53  26   ALT 0 - 44 IU/L 54  48  23       RADIOGRAPHIC STUDIES: I have personally reviewed the radiological images as listed and agreed with the findings in the report. No results found.

## 2024-05-26 NOTE — Assessment & Plan Note (Signed)
 Recurrent epistaxis. Follow up with ENT.  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  - Referred to [facility] for evaluation and management. - Coordinated with genetic counseling for potential genetic testing

## 2024-05-27 ENCOUNTER — Other Ambulatory Visit: Payer: Self-pay

## 2024-05-27 ENCOUNTER — Telehealth: Payer: Self-pay

## 2024-05-27 NOTE — Telephone Encounter (Signed)
 Referral faxed to Dr. Lorane Mazzoni MD at [facility]. Re: possible hereditary hemorrhagic telangiectasia. Fax confirmation recevied.  Fax: [phone] ph: [phone]

## 2024-06-10 NOTE — Telephone Encounter (Signed)
Pt scheduled on 2/19

## 2024-06-30 ENCOUNTER — Emergency Department (HOSPITAL_COMMUNITY)

## 2024-06-30 ENCOUNTER — Emergency Department (HOSPITAL_COMMUNITY): Admission: EM | Admit: 2024-06-30 | Discharge: 2024-06-30 | Disposition: A | Discharge status: Home / Self Care

## 2024-06-30 ENCOUNTER — Encounter (HOSPITAL_COMMUNITY): Payer: Self-pay

## 2024-06-30 ENCOUNTER — Encounter (HOSPITAL_COMMUNITY)
Admission: EM | Disposition: A | Discharge status: Home / Self Care | Payer: Self-pay | Source: Home / Self Care | Attending: Emergency Medicine

## 2024-06-30 ENCOUNTER — Other Ambulatory Visit: Payer: Self-pay

## 2024-06-30 DIAGNOSIS — R09A2 Foreign body sensation, throat: Secondary | ICD-10-CM | POA: Diagnosis present

## 2024-06-30 DIAGNOSIS — D72829 Elevated white blood cell count, unspecified: Secondary | ICD-10-CM | POA: Insufficient documentation

## 2024-06-30 DIAGNOSIS — K2289 Other specified disease of esophagus: Secondary | ICD-10-CM

## 2024-06-30 DIAGNOSIS — T18128A Food in esophagus causing other injury, initial encounter: Secondary | ICD-10-CM | POA: Insufficient documentation

## 2024-06-30 DIAGNOSIS — F17290 Nicotine dependence, other tobacco product, uncomplicated: Secondary | ICD-10-CM | POA: Insufficient documentation

## 2024-06-30 DIAGNOSIS — K200 Eosinophilic esophagitis: Secondary | ICD-10-CM | POA: Insufficient documentation

## 2024-06-30 DIAGNOSIS — R1310 Dysphagia, unspecified: Secondary | ICD-10-CM | POA: Diagnosis not present

## 2024-06-30 DIAGNOSIS — E039 Hypothyroidism, unspecified: Secondary | ICD-10-CM | POA: Insufficient documentation

## 2024-06-30 DIAGNOSIS — W44F3XA Food entering into or through a natural orifice, initial encounter: Secondary | ICD-10-CM | POA: Insufficient documentation

## 2024-06-30 HISTORY — PX: ESOPHAGOGASTRODUODENOSCOPY: SHX5428

## 2024-06-30 LAB — CBC WITH DIFFERENTIAL/PLATELET
Abs Immature Granulocytes: 0.06 K/uL (ref 0.00–0.07)
Basophils Absolute: 0.1 K/uL (ref 0.0–0.1)
Basophils Relative: 1 %
Eosinophils Absolute: 0.3 K/uL (ref 0.0–0.5)
Eosinophils Relative: 2 %
HCT: 39.7 % (ref 39.0–52.0)
Hemoglobin: 12.5 g/dL — ABNORMAL LOW (ref 13.0–17.0)
Immature Granulocytes: 1 %
Lymphocytes Relative: 8 %
Lymphs Abs: 1 K/uL (ref 0.7–4.0)
MCH: 25.5 pg — ABNORMAL LOW (ref 26.0–34.0)
MCHC: 31.5 g/dL (ref 30.0–36.0)
MCV: 81 fL (ref 80.0–100.0)
Monocytes Absolute: 0.6 K/uL (ref 0.1–1.0)
Monocytes Relative: 5 %
Neutro Abs: 10.4 K/uL — ABNORMAL HIGH (ref 1.7–7.7)
Neutrophils Relative %: 83 %
Platelets: 376 K/uL (ref 150–400)
RBC: 4.9 MIL/uL (ref 4.22–5.81)
RDW: 13.2 % (ref 11.5–15.5)
WBC: 12.3 K/uL — ABNORMAL HIGH (ref 4.0–10.5)
nRBC: 0 % (ref 0.0–0.2)

## 2024-06-30 LAB — COMPREHENSIVE METABOLIC PANEL WITH GFR
ALT: 24 U/L (ref 0–44)
AST: 36 U/L (ref 15–41)
Albumin: 4.5 g/dL (ref 3.5–5.0)
Alkaline Phosphatase: 92 U/L (ref 38–126)
Anion gap: 13 (ref 5–15)
BUN: 15 mg/dL (ref 6–20)
CO2: 25 mmol/L (ref 22–32)
Calcium: 9 mg/dL (ref 8.9–10.3)
Chloride: 103 mmol/L (ref 98–111)
Creatinine, Ser: 1.07 mg/dL (ref 0.61–1.24)
GFR, Estimated: >60 mL/min
Glucose, Bld: 89 mg/dL (ref 70–99)
Potassium: 4.1 mmol/L (ref 3.5–5.1)
Sodium: 140 mmol/L (ref 135–145)
Total Bilirubin: 0.3 mg/dL (ref 0.0–1.2)
Total Protein: 7.9 g/dL (ref 6.5–8.1)

## 2024-06-30 MED ORDER — SUGAMMADEX SODIUM 200 MG/2ML IV SOLN
INTRAVENOUS | Status: DC | PRN
  Administered 2024-06-30: 200 mg via INTRAVENOUS

## 2024-06-30 MED ORDER — PANTOPRAZOLE SODIUM 40 MG PO TBEC: 40.0000 mg | DELAYED_RELEASE_TABLET | Freq: Two times a day (BID) | ORAL | Status: DC

## 2024-06-30 MED ORDER — PANTOPRAZOLE SODIUM 40 MG PO TBEC: 40.0000 mg | DELAYED_RELEASE_TABLET | Freq: Two times a day (BID) | ORAL | 1 refills | Status: AC

## 2024-06-30 MED ORDER — GLUCAGON HCL RDNA (DIAGNOSTIC) 1 MG IJ SOLR
1.0000 mg | Freq: Once | INTRAMUSCULAR | Status: AC
  Administered 2024-06-30: 1 mg via INTRAVENOUS

## 2024-06-30 MED ORDER — SODIUM CHLORIDE 0.9 % IV SOLN: INTRAVENOUS | Status: DC

## 2024-06-30 MED ORDER — LIDOCAINE 2% (20 MG/ML) 5 ML SYRINGE
INTRAMUSCULAR | Status: DC | PRN
  Administered 2024-06-30: 100 mg via INTRAVENOUS

## 2024-06-30 MED ORDER — SUCCINYLCHOLINE CHLORIDE 200 MG/10ML IV SOSY
PREFILLED_SYRINGE | INTRAVENOUS | Status: DC | PRN
  Administered 2024-06-30: 120 mg via INTRAVENOUS

## 2024-06-30 MED ORDER — ONDANSETRON HCL 4 MG/2ML IJ SOLN
INTRAMUSCULAR | Status: DC | PRN
  Administered 2024-06-30: 4 mg via INTRAVENOUS

## 2024-06-30 MED ORDER — PROPOFOL 10 MG/ML IV BOLUS
INTRAVENOUS | Status: DC | PRN
  Administered 2024-06-30: 200 mg via INTRAVENOUS

## 2024-06-30 MED ORDER — ROCURONIUM BROMIDE 10 MG/ML (PF) SYRINGE
PREFILLED_SYRINGE | INTRAVENOUS | Status: DC | PRN
  Administered 2024-06-30: 20 mg via INTRAVENOUS

## 2024-06-30 NOTE — Anesthesia Procedure Notes (Signed)
 Procedure Name: Intubation Date/Time: 06/30/2024 2:49 PM  Performed by: Alen Motto D, CRNAPre-anesthesia Checklist: Patient identified, Emergency Drugs available, Suction available and Patient being monitored Patient Re-evaluated:Patient Re-evaluated prior to induction Oxygen Delivery Method: Circle System Utilized Preoxygenation: Pre-oxygenation with 100% oxygen Induction Type: IV induction and Rapid sequence Laryngoscope Size: Mac and 4 Grade View: Grade I Tube type: Oral Tube size: 7.5 mm Number of attempts: 1 Airway Equipment and Method: Stylet and Oral airway Placement Confirmation: ETT inserted through vocal cords under direct vision, positive ETCO2 and breath sounds checked- equal and bilateral Secured at: 21 cm Tube secured with: Tape Dental Injury: Teeth and Oropharynx as per pre-operative assessment

## 2024-06-30 NOTE — ED Provider Triage Note (Addendum)
 Emergency Medicine Provider Triage Evaluation Note  Bill [name] , a 29 y.o. male  was evaluated in triage.  Pt complains of globus sensation in the throat/upper chest. He reports that sometimes this happens, but usually goes away and this did not. Has tried to vomit. Difficulty controlling secretions. Denies SOB or trouble breathing.   Review of Systems  Positive:  Negative:   Physical Exam  BP (!) 158/127 (BP Location: Right Arm)   Pulse (!) 112   Temp 98.6 F (37 C) (Oral)   Resp 20   SpO2 100%  Gen:   Awake, no distress   Resp:  Normal effort  MSK:   Moves extremities without difficulty  Other:  No stridor.   Medical Decision Making  Medically screening exam initiated at 12:54 PM.  Appropriate orders placed.  Bill [name] was informed that the remainder of the evaluation will be completed by another provider, this initial triage assessment does not replace that evaluation, and the importance of remaining in the ED until their evaluation is complete.  Glucagon  and soda give. Will reassess. May need GI consultation.  1:13 PM ADDENDUM. Patient not feeling better. Consulted [facility].   Bernis Ernst, PA-C 06/30/24 1301    Bernis Ernst, PA-C 06/30/24 1314

## 2024-06-30 NOTE — Anesthesia Preprocedure Evaluation (Signed)
"                                    Anesthesia Evaluation  Patient identified by MRN, date of birth, ID band Patient awake    Reviewed: Allergy & Precautions, NPO status , Patient's Chart, lab work & pertinent test results  History of Anesthesia Complications Negative for: history of anesthetic complications  Airway Mallampati: II  TM Distance: >3 FB Neck ROM: Full   Comment: Large beard Dental no notable dental hx. (+) Teeth Intact   Pulmonary neg pulmonary ROS, neg sleep apnea, neg COPD, Patient abstained from smoking.Not current smoker   Pulmonary exam normal breath sounds clear to auscultation       Cardiovascular Exercise Tolerance: Good METS(-) hypertension(-) CAD and (-) Past MI negative cardio ROS (-) dysrhythmias  Rhythm:Regular Rate:Normal - Systolic murmurs    Neuro/Psych negative neurological ROS  negative psych ROS   GI/Hepatic ,neg GERD  ,,(+)     (-) substance abuse  Food bolus, can't keep anything down.   Endo/Other  neg diabetesHypothyroidism    Renal/GU negative Renal ROS     Musculoskeletal   Abdominal   Peds  Hematology   Anesthesia Other Findings Past Medical History: No date: Bleeding disorder No date: Hereditary hemorrhagic telangiectasia  Reproductive/Obstetrics                              Anesthesia Physical Anesthesia Plan  ASA: 2 and emergent  Anesthesia Plan: General   Post-op Pain Management:    Induction: Intravenous and Rapid sequence  PONV Risk Score and Plan: 2 and Ondansetron  and Dexamethasone  Airway Management Planned: Oral ETT  Additional Equipment: None  Intra-op Plan:   Post-operative Plan: Extubation in OR  Informed Consent: I have reviewed the patients History and Physical, chart, labs and discussed the procedure including the risks, benefits and alternatives for the proposed anesthesia with the patient or authorized representative who has indicated his/her  understanding and acceptance.     Dental advisory given  Plan Discussed with: CRNA and Surgeon  Anesthesia Plan Comments: (Discussed risks of anesthesia with patient, including PONV, sore throat, lip/dental/eye damage. Rare risks discussed as well, such as aspiration, cardiorespiratory and neurological sequelae, and allergic reactions. Discussed the role of CRNA in patient's perioperative care. Patient understands.)        Anesthesia Quick Evaluation  "

## 2024-06-30 NOTE — ED Triage Notes (Signed)
 Pt states that he was eating a burger at work approximately 30 min PTA and felt as though part of it lodged in his esophagus. Pt reports that he has this feeling often, but has never required endoscopy. Pt reports that he is unable to swallow liquid or his own saliva. Pt reports increased dyspnea.

## 2024-06-30 NOTE — Consult Note (Signed)
 " Consultation  Referring Provider:    ED Triage Bill [name]) Primary Care Physician:  Bill Coyer, MD Primary Gastroenterologist:        Dr. Delmar [name] Reason for Consultation:     Esophageal food impaction         HPI:   Bill [name] is a 29 y.o. 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  Patient states that he was eating a hamburger around 10:30 AM when he felt his first bite gets stuck.  He has been unable to eat, drink or swallow secretions since then.  He was having some difficulty breathing initially, but this is improved.  He continues to spit out large amounts of thick phlegm. He has a history of recurrent dysphagia, at least on a weekly basis, primarily with meats, which is usually resolved with drinking, or occasionally having to forcefully vomit food up.  He has never had a food impaction that has required endoscopy.  He has never had an upper endoscopy for any reason.  He denies any history of frequent GERD symptoms.  No history of asthma, eczema or other atopic disorders.  As a child, he was suspected to have hereditary hemorrhagic telangiectasias based on a history of recurrent severe epistaxis.  He states that genetic testing was apparently negative, but he is being referred back to [facility] for repeat testing and a second opinion.  He does have frequent nosebleeds, almost on a daily basis.  He denies any other bleeding sites.  Specifically, he denies any history of vomiting blood or blood in the stool.  Denies any history of excessive gum or skin bleeding.  A CBC in the ED showed a white blood cell count of 12 and hemoglobin of 12.5.  CMP was unremarkable. Chest x-ray in the ED showed low lung volumes, otherwise normal.  Past Medical History:  Diagnosis Date   Bleeding disorder    Hereditary hemorrhagic telangiectasia     Past Surgical  History:  Procedure Laterality Date   COLONOSCOPY WITH PROPOFOL  N/A 02/01/2018   Procedure: COLONOSCOPY WITH PROPOFOL ;  Surgeon: Bill Gladis PENNER, MD;  Location: [facility] ENDOSCOPY;  Service: Endoscopy;  Laterality: N/A;   TONSILLECTOMY      History reviewed. No pertinent family history.   Social History[1]  Prior to Admission medications  Medication Sig Start Date End Date Taking? Authorizing Provider  acetaminophen  (TYLENOL ) 325 MG tablet Take 1-2 tablets (325-650 mg total) by mouth every 6 (six) hours as needed for mild pain, moderate pain, fever or headache (or Fever >/= 101). 11/26/17   Bill [name], Aruna, MD  baclofen  (LIORESAL ) 10 MG tablet Take 1 tablet (10 mg total) by mouth 3 (three) times daily. 04/22/24   [name], Makiera, MD  gentamicin ointment (GARAMYCIN) 0.1 % Apply topically 3 (three) times daily.    [provider]  levothyroxine  (SYNTHROID ) 112 MCG tablet Take 1 tablet (112 mcg total) by mouth daily before breakfast. 04/22/24   [name], Coyer, MD    No current facility-administered medications for this encounter.   Current Outpatient Medications  Medication Sig Dispense Refill   acetaminophen  (TYLENOL ) 325 MG tablet Take 1-2 tablets (325-650 mg total) by mouth every 6 (six) hours as needed for mild pain, moderate pain, fever or headache (or Fever >/= 101).     baclofen  (LIORESAL ) 10 MG tablet Take 1 tablet (10 mg total) by mouth 3 (three) times daily. 30 each 0  gentamicin ointment (GARAMYCIN) 0.1 % Apply topically 3 (three) times daily.     levothyroxine  (SYNTHROID ) 112 MCG tablet Take 1 tablet (112 mcg total) by mouth daily before breakfast. 90 tablet 1    Allergies as of 06/30/2024 - Review Complete 06/30/2024  Allergen Reaction Noted   Penicillins Hives 06/05/2015     Review of Systems:    As per HPI, otherwise negative    Physical Exam:  Vital signs in last 24 hours: Temp:  [98.6 F (37 C)] 98.6 F (37 C) (01/08 1232) Pulse Rate:   [112] 112 (01/08 1232) Resp:  [20] 20 (01/08 1232) BP: (158)/(127) 158/127 (01/08 1232) SpO2:  [100 %] 100 % (01/08 1232)   General:   Pleasant Caucasian male in NAD accompanied by friend in triage Head:  Normocephalic and atraumatic.  Dried blood around nares and in beard Eyes:   No icterus.   Conjunctiva pink. Ears:  Normal auditory acuity. Neck:  Supple, no crepitus Lungs:  Respirations even and unlabored. Lungs clear to auscultation bilaterally.   No wheezes, crackles, or rhonchi.  Heart:  Regular rate and rhythm; no MRG Abdomen:  Soft, nondistended, nontender. Normal bowel sounds. No appreciable masses or hepatomegaly.  Rectal:  Not performed.  Msk:  Symmetrical without gross deformities.  Extremities:  Without edema. Neurologic:  Alert and  oriented x4;  grossly normal neurologically. Skin:  Intact without significant lesions or rashes. Psych:  Alert and cooperative. Normal affect.  LAB RESULTS: Recent Labs    06/30/24 1244  WBC 12.3*  HGB 12.5*  HCT 39.7  PLT 376   BMET Recent Labs    06/30/24 1244  NA 140  K 4.1  CL 103  CO2 25  GLUCOSE 89  BUN 15  CREATININE 1.07  CALCIUM 9.0   LFT Recent Labs    06/30/24 1244  PROT 7.9  ALBUMIN 4.5  AST 36  ALT 24  ALKPHOS 92  BILITOT 0.3   PT/INR No results for input(s): LABPROT, INR in the last 72 hours.  STUDIES: DG Chest Portable 1 View Result Date: 06/30/2024 EXAM: 1 VIEW(S) XRAY OF THE CHEST 06/30/2024 12:56:51 PM COMPARISON: None available. CLINICAL HISTORY: food bolus FINDINGS: LUNGS AND PLEURA: Low lung volumes with bronchovascular crowding. No pleural effusion. No pneumothorax. HEART AND MEDIASTINUM: No acute abnormality of the cardiac and mediastinal silhouettes. BONES AND SOFT TISSUES: No acute osseous abnormality. IMPRESSION: 1. Low lung volumes. No acute cardiopulmonary abnormality. Electronically signed by: Bill Myers MD MD 06/30/2024 01:25 PM EST RP Workstation: [id number]     PREVIOUS  ENDOSCOPIES:            None   Impression / Plan:   29 year old male with history of intermittent solid food dysphagia who experienced esophageal food impaction late this morning around 1030.  Currently unable to swallow oral secretions.  He has a suspected history of HHT, with his only manifestation has recurrent epistaxis.  No other bleeding symptomology.  His hemoglobin is slightly low. Will plan for emergent EGD with esophageal disimpaction today.    The details, risks (including bleeding, perforation, infection, missed lesions, medication reactions and possible hospitalization or surgery if complications occur), benefits, and alternatives to EGD with possible biopsy and possible dilation were discussed with the patient and he consents to proceed.   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    Thanks   LOS:  0 days   Glendia FORBES Holt  06/30/2024, 2:16 PM       [1]  Social History Tobacco Use   Smoking status: Never   Smokeless tobacco: Current  Vaping Use   Vaping status: Every Day   Start date: 10/22/2018  Substance Use Topics   Alcohol use: Not Currently    Alcohol/week: 5.0 standard drinks of alcohol    Types: 2 Cans of beer, 3 Shots of liquor per week   Drug use: Never   "

## 2024-06-30 NOTE — Discharge Instructions (Addendum)
 YOU HAD AN ENDOSCOPIC PROCEDURE TODAY: Refer to the procedure report and other information in the discharge instructions given to you for any specific questions about what was found during the examination. If this information does not answer your questions, please call [place] office at [phone] to clarify.   YOU SHOULD EXPECT: Some feelings of bloating in the abdomen. Passage of more gas than usual. Walking can help get rid of the air that was put into your GI tract during the procedure and reduce the bloating. If you had a lower endoscopy (such as a colonoscopy or flexible sigmoidoscopy) you may notice spotting of blood in your stool or on the toilet paper. Some abdominal soreness may be present for a day or two, also.  DIET: Your first meal following the procedure should be a light meal and then it is ok to progress to your normal diet. A half-sandwich or bowl of soup is an example of a good first meal. Heavy or fried foods are harder to digest and may make you feel nauseous or bloated. Drink plenty of fluids but you should avoid alcoholic beverages for 24 hours. If you had a esophageal dilation, please see attached instructions for diet.    ACTIVITY: Your care partner should take you home directly after the procedure. You should plan to take it easy, moving slowly for the rest of the day. You can resume normal activity the day after the procedure however YOU SHOULD NOT DRIVE, use power tools, machinery or perform tasks that involve climbing or major physical exertion for 24 hours (because of the sedation medicines used during the test).   SYMPTOMS TO REPORT IMMEDIATELY: A gastroenterologist can be reached at any hour. Please call [phone]  for any of the following symptoms:  Following lower endoscopy (colonoscopy, flexible sigmoidoscopy) Excessive amounts of blood in the stool  Significant tenderness, worsening of abdominal pains  Swelling of the abdomen that is new, acute  Fever of 100 or  higher  Following upper endoscopy (EGD, EUS, ERCP, esophageal dilation) Vomiting of blood or coffee ground material  New, significant abdominal pain  New, significant chest pain or pain under the shoulder blades  Painful or persistently difficult swallowing  New shortness of breath  Black, tarry-looking or red, bloody stools  FOLLOW UP:  If any biopsies were taken you will be contacted by phone or by letter within the next 1-3 weeks. Call [phone]  if you have not heard about the biopsies in 3 weeks.  Please also call with any specific questions about appointments or follow up tests.YOU HAD AN ENDOSCOPIC PROCEDURE TODAY: Refer to the procedure report and other information in the discharge instructions given to you for any specific questions about what was found during the examination. If this information does not answer your questions, please call [place] office at [phone] to clarify.   YOU SHOULD EXPECT: Some feelings of bloating in the abdomen. Passage of more gas than usual. Walking can help get rid of the air that was put into your GI tract during the procedure and reduce the bloating. If you had a lower endoscopy (such as a colonoscopy or flexible sigmoidoscopy) you may notice spotting of blood in your stool or on the toilet paper. Some abdominal soreness may be present for a day or two, also.  DIET: Your first meal following the procedure should be a light meal and then it is ok to progress to your normal diet. A half-sandwich or bowl of soup is an example of a  good first meal. Heavy or fried foods are harder to digest and may make you feel nauseous or bloated. Drink plenty of fluids but you should avoid alcoholic beverages for 24 hours. If you had a esophageal dilation, please see attached instructions for diet.    ACTIVITY: Your care partner should take you home directly after the procedure. You should plan to take it easy, moving slowly for the rest of the day. You can resume  normal activity the day after the procedure however YOU SHOULD NOT DRIVE, use power tools, machinery or perform tasks that involve climbing or major physical exertion for 24 hours (because of the sedation medicines used during the test).   SYMPTOMS TO REPORT IMMEDIATELY: A gastroenterologist can be reached at any hour. Please call [phone]  for any of the following symptoms:  Following upper endoscopy (EGD, EUS, ERCP, esophageal dilation) Vomiting of blood or coffee ground material  New, significant abdominal pain  New, significant chest pain or pain under the shoulder blades  Painful or persistently difficult swallowing  New shortness of breath  Black, tarry-looking or red, bloody stools  FOLLOW UP:  If any biopsies were taken you will be contacted by phone or by letter within the next 1-3 weeks. Call [phone]  if you have not heard about the biopsies in 3 weeks.  Please also call with any specific questions about appointments or follow up tests.         [facility] ENDOSCOPY [address] [place], [place]  [address] Phone:  [phone]   Patient: Bill [name] Date of Birth: 1996/03/26 Date of Visit: 06/30/2024  To Whom It May Concern:  Ismael Karge was seen and treated on 06/30/2024 and may return to work 07/02/2024.           Sincerely,   [facility] Endoscopy  Glendia Holt, MD  [phone]

## 2024-06-30 NOTE — Op Note (Signed)
 [facility] Patient Name: Bill [name] Procedure Date : 06/30/2024 MRN: [id number] Attending MD: Bill [name] , MD, [id number] Date of Birth: 1996-02-18 CSN: [id number] Age: 29 Admit Type: Outpatient Procedure:                Upper GI endoscopy Indications:              Foreign body in the esophagus Providers:                Bill E. Stacia, MD, Bill Hummer, RN,                            Bill [name], Technician Referring MD:              Medicines:                General Anesthesia Complications:            No immediate complications. Estimated Blood Loss:     Estimated blood loss was minimal. Procedure:                Pre-Anesthesia Assessment:                           - Prior to the procedure, a History and Physical                            was performed, and patient medications and                            allergies were reviewed. The patient's tolerance of                            previous anesthesia was also reviewed. The risks                            and benefits of the procedure and the sedation                            options and risks were discussed with the patient.                            All questions were answered, and informed consent                            was obtained. Prior Anticoagulants: The patient has                            taken no anticoagulant or antiplatelet agents. ASA                            Grade Assessment: II - A patient with mild systemic                            disease. After reviewing the risks and benefits,  the patient was deemed in satisfactory condition to                            undergo the procedure.                           After obtaining informed consent, the endoscope was                            passed under direct vision. Throughout the                            procedure, the patient's blood pressure, pulse, and                             oxygen saturations were monitored continuously. The                            [id number]-H190 ([id number]) Olympus endoscope was introduced                            through the mouth, and advanced to the second part                            of duodenum. The upper GI endoscopy was                            accomplished without difficulty. The patient                            tolerated the procedure well. Scope In: Scope Out: Findings:      Food was found in the proximal esophagus. Removal was accomplished with       a gentle downward pressure of the endoscope with water lavage. Estimated       blood loss: none.      Mucosal changes including ringed esophagus, longitudinal furrows and       diffuse small-caliber esophagus were found in the entire esophagus.       Esophageal findings were graded using the Eosinophilic Esophagitis       Endoscopic Reference Score (EoE-EREFS) as: Edema Grade 1 Present       (decreased clarity or absence of vascular markings), Rings Grade 1 Mild       (subtle circumferential ridges seen on esophageal distension), Exudates       Grade 0 None (no white lesions seen), Furrows Grade 1 Mild (vertical       lines without visible depth) and Stricture present. Biopsies were       obtained from the proximal and distal esophagus with cold forceps for       histology of suspected eosinophilic esophagitis. Tug sign was       positive. Estimated blood loss was minimal.      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 Biopsies were taken with a cold       forceps for histology. Estimated blood loss was  minimal.      The entire examined stomach was normal.      The examined duodenum was normal. Impression:               - Food in the proximal esophagus. Removal was                            successful through gentle downward pressure with                            water lavage.                            - Esophageal mucosal changes suggestive of                            eosinophilic esophagitis.                           - Normal stomach.                           - Normal examined duodenum.                           - Biopsies were taken with a cold forceps for                            evaluation of eosinophilic esophagitis. Moderate Sedation:      N/A Recommendation:           - Patient has a contact number available for                            emergencies. The signs and symptoms of potential                            delayed complications were discussed with the                            patient. Return to normal activities tomorrow.                            Written discharge instructions were provided to the                            patient.                           - Soft diet.                           - Use Protonix  (pantoprazole ) 40 mg PO BID for 8                            weeks.                           -  Await pathology results.                           - Repeat upper endoscopy in 8 weeks to evaluate the                            response to therapy. Procedure Code(s):        --- Professional ---                           [phone], Esophagogastroduodenoscopy, flexible,                            transoral; with removal of foreign body(s)                           43239, Esophagogastroduodenoscopy, flexible,                            transoral; with biopsy, single or multiple Diagnosis Code(s):        --- Professional ---                           U81.871J, Food in esophagus causing other injury,                            initial encounter                           K22.89, Other specified disease of esophagus                           T18.108A, Unspecified foreign body in esophagus                            causing other injury, initial encounter CPT copyright 2022 American Medical Association. All rights reserved. The codes documented in this report  are preliminary and upon coder review may  be revised to meet current compliance requirements. Bill [name] E. Stacia, MD 06/30/2024 3:21:05 PM This report has been signed electronically. Number of Addenda: 0

## 2024-06-30 NOTE — ED Provider Notes (Signed)
 " [facility] ENDOSCOPY Provider Note   CSN: [id number] Arrival date & time: 06/30/24  1228     Patient presents with: Globus   Bill [name] is a 29 y.o. male with HHT, hypothyroidism presents to the ER today for evaluation of globus sensation. Shortly prior to arrival, the patient reports that he was eating a hamburger and felt something lodge in his throat. He reports that this happens once a week, but is usually able to get it to pass. This time was worse. Has tried to vomit without success. Denies trouble breathing. Has not had an endoscopy before.   HPI     Prior to Admission medications  Medication Sig Start Date End Date Taking? Authorizing Provider  acetaminophen  (TYLENOL ) 325 MG tablet Take 1-2 tablets (325-650 mg total) by mouth every 6 (six) hours as needed for mild pain, moderate pain, fever or headache (or Fever >/= 101). 11/26/17   Gouru, Aruna, MD  baclofen  (LIORESAL ) 10 MG tablet Take 1 tablet (10 mg total) by mouth 3 (three) times daily. 04/22/24   Simmons-Robinson, Makiera, MD  gentamicin ointment (GARAMYCIN) 0.1 % Apply topically 3 (three) times daily.    [provider]  levothyroxine  (SYNTHROID ) 112 MCG tablet Take 1 tablet (112 mcg total) by mouth daily before breakfast. 04/22/24   Simmons-Robinson, Makiera, MD    Allergies: Penicillins    Review of Systems  Constitutional:  Negative for chills and fever.  HENT:  Positive for sore throat.   Respiratory:  Negative for shortness of breath.   Cardiovascular:  Negative for chest pain.    Updated Vital Signs BP 137/84   Pulse (!) 115   Temp 98 F (36.7 C) (Temporal)   Resp 16   Ht 5' 7 (1.702 m)   Wt 90.7 kg   SpO2 98%   BMI 31.32 kg/m   Physical Exam Vitals and nursing note reviewed.  Constitutional:      Appearance: He is not toxic-appearing.  HENT:     Mouth/Throat:     Comments: Uvula midline, airway patent. Normal phonation.  Eyes:     General: No scleral  icterus. Cardiovascular:     Rate and Rhythm: Tachycardia present.  Pulmonary:     Effort: Pulmonary effort is normal. No respiratory distress.     Breath sounds: Normal breath sounds. No stridor.  Abdominal:     Palpations: Abdomen is soft.     Tenderness: There is no abdominal tenderness.  Skin:    General: Skin is warm and dry.  Neurological:     Mental Status: He is alert.     (all labs ordered are listed, but only abnormal results are displayed) Labs Reviewed  CBC WITH DIFFERENTIAL/PLATELET - Abnormal; Notable for the following components:      Result Value   WBC 12.3 (*)    Hemoglobin 12.5 (*)    MCH 25.5 (*)    Neutro Abs 10.4 (*)    All other components within normal limits  COMPREHENSIVE METABOLIC PANEL WITH GFR  SURGICAL PATHOLOGY    EKG: None  Radiology: DG Chest Portable 1 View Result Date: 06/30/2024 EXAM: 1 VIEW(S) XRAY OF THE CHEST 06/30/2024 12:56:51 PM COMPARISON: None available. CLINICAL HISTORY: food bolus FINDINGS: LUNGS AND PLEURA: Low lung volumes with bronchovascular crowding. No pleural effusion. No pneumothorax. HEART AND MEDIASTINUM: No acute abnormality of the cardiac and mediastinal silhouettes. BONES AND SOFT TISSUES: No acute osseous abnormality. IMPRESSION: 1. Low lung volumes. No acute cardiopulmonary abnormality. Electronically signed  by: Rogelia Myers MD MD 06/30/2024 01:25 PM EST RP Workstation: [id number]   Procedures   Medications Ordered in the ED  0.9 %  sodium chloride  infusion ( Intravenous Continued from Pre-op 06/30/24 1441)  glucagon  (human recombinant) (GLUCAGEN ) injection 1 mg (1 mg Intravenous Given 06/30/24 1243)   Medical Decision Making Amount and/or Complexity of Data Reviewed Labs: ordered. Radiology: ordered.  Risk Prescription drug management.   29 y.o. male presents to the ER for evaluation of globus sensation. Differential diagnosis includes but is not limited to food bolus, dysphagia, spasm, stricture,  perforation. Vital signs Tachycardia, otherwise unremarkable. Physical exam as noted above.   Patient was given glucagon  and soda for likely food bolus. He was eating and suddenly felt a globus sensation. Labs and XR ordered. No stridor. Normal phonation.   I independently reviewed and interpreted the patient's labs. CMP unremarkable. CBC shows slight leukocytosis. Hemoglobin 12.5.  CXR 1. Low lung volumes. No acute cardiopulmonary abnormality. Per radiologist's interpretation.    Consulted GI and spoke with Dr. Stacia who cam to evaluate the patient. They will be taking him to endoscopy.  Patient to endoscopy now and will be discharged from PACU.   Portions of this report may have been transcribed using voice recognition software. Every effort was made to ensure accuracy; however, inadvertent computerized transcription errors may be present.    Final diagnoses:  None    ED Discharge Orders     None          Bernis Ernst, [place] 06/30/24 1511    Francesca Elsie CROME, MD 06/30/24 1605  "

## 2024-06-30 NOTE — Transfer of Care (Signed)
 Immediate Anesthesia Transfer of Care Note  Patient: Bill [name]  Procedure(s) Performed: EGD (ESOPHAGOGASTRODUODENOSCOPY)  Patient Location: PACU  Anesthesia Type:General  Level of Consciousness: drowsy  Airway & Oxygen Therapy: Patient Spontanous Breathing and Patient connected to face mask oxygen  Post-op Assessment: Report given to RN and Post -op Vital signs reviewed and stable  Post vital signs: Reviewed and stable  Last Vitals:  Vitals Value Taken Time  BP 128/75 06/30/24 15:11  Temp    Pulse 91 06/30/24 15:12  Resp 21 06/30/24 15:12  SpO2 99 % 06/30/24 15:12  Vitals shown include unfiled device data.  Last Pain:  Vitals:   06/30/24 1511  TempSrc:   PainSc: Asleep         Complications: There were no known notable events for this encounter.

## 2024-06-30 NOTE — Anesthesia Postprocedure Evaluation (Signed)
"   Anesthesia Post Note  Patient: Bill [name]  Procedure(s) Performed: EGD (ESOPHAGOGASTRODUODENOSCOPY)     Patient location during evaluation: PACU Anesthesia Type: General Level of consciousness: awake and alert, oriented and patient cooperative Pain management: pain level controlled Vital Signs Assessment: post-procedure vital signs reviewed and stable Respiratory status: spontaneous breathing, nonlabored ventilation and respiratory function stable Cardiovascular status: blood pressure returned to baseline and stable Postop Assessment: no apparent nausea or vomiting Anesthetic complications: no   There were no known notable events for this encounter.  Last Vitals:  Vitals:   06/30/24 1530 06/30/24 1540  BP: 132/85 131/82  Pulse: 95 90  Resp: 15 18  Temp:    SpO2: 96% 96%    Last Pain:  Vitals:   06/30/24 1540  TempSrc:   PainSc: 0-No pain                 Almarie HERO Leshonda Galambos      "

## 2024-07-01 ENCOUNTER — Encounter (HOSPITAL_COMMUNITY): Payer: Self-pay | Admitting: Gastroenterology

## 2024-07-04 LAB — SURGICAL PATHOLOGY

## 2024-07-09 ENCOUNTER — Ambulatory Visit: Payer: Self-pay | Admitting: Gastroenterology

## 2024-07-09 NOTE — Progress Notes (Signed)
 Bill [name],  The biopsies from the esophagus on your recent upper endoscopy were notable for elevated eosinophils (inflammatory cells).  The biopsy results and images during your endoscopy are consistent with Eosinophilic Esophagitis (EoE).  This is an inflammatory condition of your esophagus which is typically triggered by ingestion of certain foods (allergens) and can result in difficulty swallowing.  This can be managed through a combination of medications with or without the need for repeat endoscopy with dilation (stretching) of the esophagus as needed.  Sometimes the inflammation improves with acid reducing medication alone (pantoprazole ).  Please continue to take the pantoprazole  twice daily until we repeat an upper endoscopy.  I would like to repeat an upper endoscopy in 8-10 weeks and repeat biopsies and possible dilate any focal strictures of the esophagus.  I know that you are being referred to [facility] for testing for possible hereditary hemorrhagic telangiectasia.  If this diagnosis is confirmed, we should do your repeat EGD in the hospital setting due to the possibility of severe epistaxis while you are sedated. For this reason, I will hold off on scheduling a repeat EGD until you have been assessed by [facility] hematology.  Do you have an appointment scheduled with [facility]?

## 2024-08-22 ENCOUNTER — Ambulatory Visit: Admitting: Family Medicine

## 2024-10-24 ENCOUNTER — Encounter: Admitting: Family Medicine
